# Patient Record
Sex: Female | Born: 1955 | Race: White | Hispanic: No | Marital: Married | Smoking: Current every day smoker
Health system: Southern US, Community
[De-identification: ages and names within clinical notes are randomized; demographics above are authoritative.]

## PROBLEM LIST (undated history)

## (undated) DIAGNOSIS — Z803 Family history of malignant neoplasm of breast: Secondary | ICD-10-CM

## (undated) DIAGNOSIS — Z85828 Personal history of other malignant neoplasm of skin: Secondary | ICD-10-CM

## (undated) DIAGNOSIS — Z8051 Family history of malignant neoplasm of kidney: Secondary | ICD-10-CM

## (undated) DIAGNOSIS — Z801 Family history of malignant neoplasm of trachea, bronchus and lung: Secondary | ICD-10-CM

## (undated) DIAGNOSIS — Z808 Family history of malignant neoplasm of other organs or systems: Secondary | ICD-10-CM

## (undated) DIAGNOSIS — E7800 Pure hypercholesterolemia, unspecified: Secondary | ICD-10-CM

## (undated) DIAGNOSIS — I10 Essential (primary) hypertension: Secondary | ICD-10-CM

## (undated) HISTORY — DX: Family history of malignant neoplasm of kidney: Z80.51

## (undated) HISTORY — PX: ROTATOR CUFF REPAIR: SHX139

## (undated) HISTORY — DX: Family history of malignant neoplasm of trachea, bronchus and lung: Z80.1

## (undated) HISTORY — PX: CHOLECYSTECTOMY: SHX55

## (undated) HISTORY — PX: ABDOMINAL HYSTERECTOMY: SHX81

## (undated) HISTORY — DX: Family history of malignant neoplasm of other organs or systems: Z80.8

## (undated) HISTORY — DX: Family history of malignant neoplasm of breast: Z80.3

## (undated) HISTORY — DX: Personal history of other malignant neoplasm of skin: Z85.828

## (undated) HISTORY — PX: MANDIBLE SURGERY: SHX707

## (undated) HISTORY — PX: TUBAL LIGATION: SHX77

## (undated) HISTORY — PX: CARPAL TUNNEL RELEASE: SHX101

---

## 1997-11-29 ENCOUNTER — Ambulatory Visit (HOSPITAL_COMMUNITY): Admission: RE | Admit: 1997-11-29 | Discharge: 1997-11-29 | Payer: Self-pay | Admitting: Family Medicine

## 1999-01-15 ENCOUNTER — Other Ambulatory Visit: Admission: RE | Admit: 1999-01-15 | Discharge: 1999-01-15 | Payer: Self-pay | Admitting: Family Medicine

## 1999-03-23 ENCOUNTER — Ambulatory Visit: Admission: RE | Admit: 1999-03-23 | Discharge: 1999-03-23 | Payer: Self-pay | Admitting: Family Medicine

## 2000-01-25 ENCOUNTER — Other Ambulatory Visit: Admission: RE | Admit: 2000-01-25 | Discharge: 2000-02-11 | Payer: Self-pay | Admitting: Family Medicine

## 2001-02-09 ENCOUNTER — Other Ambulatory Visit: Admission: RE | Admit: 2001-02-09 | Discharge: 2001-02-09 | Payer: Self-pay | Admitting: Family Medicine

## 2002-02-15 ENCOUNTER — Other Ambulatory Visit: Admission: RE | Admit: 2002-02-15 | Discharge: 2002-02-15 | Payer: Self-pay | Admitting: Family Medicine

## 2002-04-17 ENCOUNTER — Encounter: Payer: Self-pay | Admitting: Family Medicine

## 2002-04-17 ENCOUNTER — Ambulatory Visit (HOSPITAL_COMMUNITY): Admission: RE | Admit: 2002-04-17 | Discharge: 2002-04-17 | Payer: Self-pay | Admitting: Family Medicine

## 2003-02-21 ENCOUNTER — Other Ambulatory Visit: Admission: RE | Admit: 2003-02-21 | Discharge: 2003-02-21 | Payer: Self-pay | Admitting: Family Medicine

## 2004-03-05 ENCOUNTER — Other Ambulatory Visit: Admission: RE | Admit: 2004-03-05 | Discharge: 2004-03-05 | Payer: Self-pay | Admitting: Family Medicine

## 2007-02-22 ENCOUNTER — Encounter: Admission: RE | Admit: 2007-02-22 | Discharge: 2007-02-22 | Payer: Self-pay | Admitting: Family Medicine

## 2009-02-03 ENCOUNTER — Encounter: Admission: RE | Admit: 2009-02-03 | Discharge: 2009-02-03 | Payer: Self-pay | Admitting: Gastroenterology

## 2010-08-24 ENCOUNTER — Emergency Department (HOSPITAL_COMMUNITY): Payer: 59

## 2010-08-24 ENCOUNTER — Emergency Department (HOSPITAL_COMMUNITY)
Admission: EM | Admit: 2010-08-24 | Discharge: 2010-08-24 | Disposition: A | Discharge status: Home / Self Care | Payer: 59 | Attending: Emergency Medicine | Admitting: Emergency Medicine

## 2010-08-24 DIAGNOSIS — E785 Hyperlipidemia, unspecified: Secondary | ICD-10-CM | POA: Insufficient documentation

## 2010-08-24 DIAGNOSIS — R42 Dizziness and giddiness: Secondary | ICD-10-CM | POA: Insufficient documentation

## 2010-08-24 DIAGNOSIS — H6120 Impacted cerumen, unspecified ear: Secondary | ICD-10-CM | POA: Insufficient documentation

## 2010-08-24 DIAGNOSIS — H539 Unspecified visual disturbance: Secondary | ICD-10-CM | POA: Insufficient documentation

## 2010-08-24 DIAGNOSIS — R110 Nausea: Secondary | ICD-10-CM | POA: Insufficient documentation

## 2010-08-24 DIAGNOSIS — I10 Essential (primary) hypertension: Secondary | ICD-10-CM | POA: Insufficient documentation

## 2010-08-24 LAB — BASIC METABOLIC PANEL
CO2: 26 mEq/L (ref 19–32)
Calcium: 9.1 mg/dL (ref 8.4–10.5)
Chloride: 107 mEq/L (ref 96–112)
GFR calc Af Amer: >60 mL/min (ref >60)
Glucose, Bld: 101 mg/dL — ABNORMAL HIGH (ref 70–99)
Potassium: 4.8 mEq/L (ref 3.5–5.1)
Sodium: 141 mEq/L (ref 135–145)

## 2010-08-24 LAB — DIFFERENTIAL
Basophils Absolute: 0.1 10*3/uL (ref 0.0–0.1)
Basophils Relative: 1 % (ref 0–1)
Eosinophils Absolute: 0.4 10*3/uL (ref 0.0–0.7)
Eosinophils Relative: 4 % (ref 0–5)
Lymphocytes Relative: 40 % (ref 12–46)
Lymphs Abs: 3.6 10*3/uL (ref 0.7–4.0)
Monocytes Absolute: 0.5 10*3/uL (ref 0.1–1.0)
Monocytes Relative: 6 % (ref 3–12)
Neutro Abs: 4.6 10*3/uL (ref 1.7–7.7)
Neutrophils Relative %: 50 % (ref 43–77)

## 2010-08-24 LAB — URINALYSIS, ROUTINE W REFLEX MICROSCOPIC
Bilirubin Urine: NEGATIVE
Glucose, UA: NEGATIVE mg/dL
Ketones, ur: NEGATIVE mg/dL
Nitrite: NEGATIVE
Protein, ur: NEGATIVE mg/dL
Specific Gravity, Urine: 1.01 (ref 1.005–1.030)
Urobilinogen, UA: 0.2 mg/dL (ref 0.0–1.0)
pH: 6 (ref 5.0–8.0)

## 2010-08-24 LAB — CBC
HCT: 43.9 % (ref 36.0–46.0)
Hemoglobin: 15.3 g/dL — ABNORMAL HIGH (ref 12.0–15.0)
MCH: 32.4 pg (ref 26.0–34.0)
MCHC: 34.9 g/dL (ref 30.0–36.0)
MCV: 93 fL (ref 78.0–100.0)
Platelets: 227 10*3/uL (ref 150–400)
RBC: 4.72 MIL/uL (ref 3.87–5.11)
RDW: 13 % (ref 11.5–15.5)
WBC: 9.2 10*3/uL (ref 4.0–10.5)

## 2010-08-24 LAB — URINE MICROSCOPIC-ADD ON

## 2010-08-24 MED ORDER — GADOBENATE DIMEGLUMINE 529 MG/ML IV SOLN
15.0000 mL | Freq: Once | INTRAVENOUS | Status: AC
  Administered 2010-08-24: 15 mL via INTRAVENOUS

## 2010-09-14 ENCOUNTER — Other Ambulatory Visit: Payer: Self-pay | Admitting: Family Medicine

## 2010-09-14 ENCOUNTER — Other Ambulatory Visit (HOSPITAL_COMMUNITY)
Admission: RE | Admit: 2010-09-14 | Discharge: 2010-09-14 | Disposition: A | Discharge status: Home / Self Care | Payer: 59 | Source: Ambulatory Visit | Attending: Family Medicine | Admitting: Family Medicine

## 2010-09-14 DIAGNOSIS — Z01419 Encounter for gynecological examination (general) (routine) without abnormal findings: Secondary | ICD-10-CM | POA: Insufficient documentation

## 2011-06-28 ENCOUNTER — Other Ambulatory Visit: Payer: Self-pay | Admitting: Gastroenterology

## 2012-11-18 ENCOUNTER — Telehealth: Payer: Self-pay | Admitting: Diagnostic Neuroimaging

## 2012-11-18 NOTE — Telephone Encounter (Signed)
Calling to reschedule patient with Dr.Penumalli  02/02/2013 appt

## 2013-01-21 ENCOUNTER — Telehealth: Payer: Self-pay | Admitting: Diagnostic Neuroimaging

## 2013-02-02 ENCOUNTER — Ambulatory Visit: Payer: Self-pay | Admitting: Diagnostic Neuroimaging

## 2013-04-08 ENCOUNTER — Other Ambulatory Visit: Payer: Self-pay | Admitting: Family Medicine

## 2013-04-08 DIAGNOSIS — R319 Hematuria, unspecified: Secondary | ICD-10-CM

## 2013-04-12 ENCOUNTER — Ambulatory Visit
Admission: RE | Admit: 2013-04-12 | Discharge: 2013-04-12 | Disposition: A | Discharge status: Home / Self Care | Payer: 59 | Source: Ambulatory Visit | Attending: Family Medicine | Admitting: Family Medicine

## 2013-04-12 DIAGNOSIS — R319 Hematuria, unspecified: Secondary | ICD-10-CM

## 2013-05-11 ENCOUNTER — Ambulatory Visit: Payer: Self-pay | Admitting: Diagnostic Neuroimaging

## 2013-07-08 ENCOUNTER — Other Ambulatory Visit: Payer: Self-pay | Admitting: Gastroenterology

## 2013-10-05 ENCOUNTER — Encounter (HOSPITAL_BASED_OUTPATIENT_CLINIC_OR_DEPARTMENT_OTHER): Payer: Self-pay | Admitting: Emergency Medicine

## 2013-10-05 ENCOUNTER — Emergency Department (HOSPITAL_BASED_OUTPATIENT_CLINIC_OR_DEPARTMENT_OTHER)
Admission: EM | Admit: 2013-10-05 | Discharge: 2013-10-05 | Disposition: A | Discharge status: Home / Self Care | Payer: 59 | Attending: Emergency Medicine | Admitting: Emergency Medicine

## 2013-10-05 DIAGNOSIS — J019 Acute sinusitis, unspecified: Secondary | ICD-10-CM | POA: Insufficient documentation

## 2013-10-05 HISTORY — DX: Pure hypercholesterolemia, unspecified: E78.00

## 2013-10-05 HISTORY — DX: Essential (primary) hypertension: I10

## 2013-10-05 MED ORDER — ZITHROMAX Z-PAK 250 MG PO TABS: ORAL_TABLET | ORAL | Status: DC

## 2013-10-05 NOTE — ED Provider Notes (Signed)
CSN: [id number]     Arrival date & time 10/05/13  1043 History   First MD Initiated Contact with Patient 10/05/13 1054     Chief Complaint  Patient presents with  . Nasal Congestion     (Consider location/radiation/quality/duration/timing/severity/associated sxs/prior Treatment) HPI Comments: Patient is a 58 year old female with no significant past medical history. She presents today with complaints of sinus pain, congestion, green and bloody nasal discharge that has been ongoing for the past week. She was seen by her primary Dr. and told it was likely seasonal allergies. She did not improve with prednisone and an allergy pill. She called her doctor and was informed that a prescription for an antibiotic would be called in. Apparently there is been some confusion and she has been unable to get this medication from the pharmacy as they say they have not received the prescription.  Patient is a 58 y.o. female presenting with URI. The history is provided by the patient.  URI Presenting symptoms: congestion, cough and facial pain   Severity:  Moderate Onset quality:  Gradual Duration:  1 week Timing:  Constant Progression:  Worsening Chronicity:  New Relieved by:  Nothing Worsened by:  Nothing tried Ineffective treatments:  None tried   No past medical history on file. No past surgical history on file. No family history on file. History  Substance Use Topics  . Smoking status: Not on file  . Smokeless tobacco: Not on file  . Alcohol Use: Not on file   OB History   No data available     Review of Systems  HENT: Positive for congestion.   Respiratory: Positive for cough.   All other systems reviewed and are negative.     Allergies  Review of patient's allergies indicates not on file.  Home Medications   Prior to Admission medications   Not on File   BP 187/90  Pulse 84  Temp(Src) 97.9 F (36.6 C) (Oral)  Resp 18  SpO2 97% Physical Exam  Nursing note and vitals  reviewed. Constitutional: She is oriented to person, place, and time. She appears well-developed and well-nourished. No distress.  HENT:  Head: Normocephalic and atraumatic.  Mouth/Throat: Oropharynx is clear and moist.  TMs are clear bilaterally.  There is maxillofacial and frontal sinus tenderness to palpation.    Neck: Normal range of motion. Neck supple.  Cardiovascular: Normal rate and regular rhythm.  Exam reveals no gallop and no friction rub.   No murmur heard. Pulmonary/Chest: Effort normal and breath sounds normal. No respiratory distress. She has no wheezes.  Abdominal: Soft. Bowel sounds are normal. She exhibits no distension. There is no tenderness.  Musculoskeletal: Normal range of motion.  Lymphadenopathy:    She has no cervical adenopathy.  Neurological: She is alert and oriented to person, place, and time.  Skin: Skin is warm and dry. She is not diaphoretic.    ED Course  Procedures (including critical care time) Labs Review Labs Reviewed - No data to display  Imaging Review No results found.   EKG Interpretation None      MDM   Final diagnoses:  None    History and exam consistent with acute sinusitis. We'll prescribe Zithromax and when necessary followup. Patient also smokes one pack a day and was advised she should pursue smoking cessation techniques.    Geoffery Lyonsouglas Salih Williamson, MD 10/05/13 1106

## 2013-10-05 NOTE — Discharge Instructions (Signed)
Zithromax as prescribed.  Return to the emergency department for difficulty breathing, chest pain, or significant worsening of your symptoms.   Sinusitis Sinusitis is redness, soreness, and swelling (inflammation) of the paranasal sinuses. Paranasal sinuses are air pockets within the bones of your face (beneath the eyes, the middle of the forehead, or above the eyes). In healthy paranasal sinuses, mucus is able to drain out, and air is able to circulate through them by way of your nose. However, when your paranasal sinuses are inflamed, mucus and air can become trapped. This can allow bacteria and other germs to grow and cause infection. Sinusitis can develop quickly and last only a short time (acute) or continue over a long period (chronic). Sinusitis that lasts for more than 12 weeks is considered chronic.  CAUSES  Causes of sinusitis include:  Allergies.  Structural abnormalities, such as displacement of the cartilage that separates your nostrils (deviated septum), which can decrease the air flow through your nose and sinuses and affect sinus drainage.  Functional abnormalities, such as when the small hairs (cilia) that line your sinuses and help remove mucus do not work properly or are not present. SYMPTOMS  Symptoms of acute and chronic sinusitis are the same. The primary symptoms are pain and pressure around the affected sinuses. Other symptoms include:  Upper toothache.  Earache.  Headache.  Bad breath.  Decreased sense of smell and taste.  A cough, which worsens when you are lying flat.  Fatigue.  Fever.  Thick drainage from your nose, which often is green and may contain pus (purulent).  Swelling and warmth over the affected sinuses. DIAGNOSIS  Your caregiver will perform a physical exam. During the exam, your caregiver may:  Look in your nose for signs of abnormal growths in your nostrils (nasal polyps).  Tap over the affected sinus to check for signs of  infection.  View the inside of your sinuses (endoscopy) with a special imaging device with a light attached (endoscope), which is inserted into your sinuses. If your caregiver suspects that you have chronic sinusitis, one or more of the following tests may be recommended:  Allergy tests.  Nasal culture A sample of mucus is taken from your nose and sent to a lab and screened for bacteria.  Nasal cytology A sample of mucus is taken from your nose and examined by your caregiver to determine if your sinusitis is related to an allergy. TREATMENT  Most cases of acute sinusitis are related to a viral infection and will resolve on their own within 10 days. Sometimes medicines are prescribed to help relieve symptoms (pain medicine, decongestants, nasal steroid sprays, or saline sprays).  However, for sinusitis related to a bacterial infection, your caregiver will prescribe antibiotic medicines. These are medicines that will help kill the bacteria causing the infection.  Rarely, sinusitis is caused by a fungal infection. In theses cases, your caregiver will prescribe antifungal medicine. For some cases of chronic sinusitis, surgery is needed. Generally, these are cases in which sinusitis recurs more than 3 times per year, despite other treatments. HOME CARE INSTRUCTIONS   Drink plenty of water. Water helps thin the mucus so your sinuses can drain more easily.  Use a humidifier.  Inhale steam 3 to 4 times a day (for example, sit in the bathroom with the shower running).  Apply a warm, moist washcloth to your face 3 to 4 times a day, or as directed by your caregiver.  Use saline nasal sprays to help moisten and clean  your sinuses.  Take over-the-counter or prescription medicines for pain, discomfort, or fever only as directed by your caregiver. SEEK IMMEDIATE MEDICAL CARE IF:  You have increasing pain or severe headaches.  You have nausea, vomiting, or drowsiness.  You have swelling around your  face.  You have vision problems.  You have a stiff neck.  You have difficulty breathing. MAKE SURE YOU:   Understand these instructions.  Will watch your condition.  Will get help right away if you are not doing well or get worse. Document Released: 05/20/2005 Document Revised: 08/12/2011 Document Reviewed: 06/04/2011 [facility] Patient Information 2014 [place], [place].

## 2013-10-07 NOTE — Telephone Encounter (Signed)
Closing encounter

## 2015-09-12 ENCOUNTER — Encounter (HOSPITAL_BASED_OUTPATIENT_CLINIC_OR_DEPARTMENT_OTHER): Payer: Self-pay | Admitting: Emergency Medicine

## 2015-09-12 ENCOUNTER — Emergency Department (HOSPITAL_BASED_OUTPATIENT_CLINIC_OR_DEPARTMENT_OTHER)
Admission: EM | Admit: 2015-09-12 | Discharge: 2015-09-12 | Disposition: A | Discharge status: Home / Self Care | Payer: 59 | Attending: Emergency Medicine | Admitting: Emergency Medicine

## 2015-09-12 ENCOUNTER — Emergency Department (HOSPITAL_BASED_OUTPATIENT_CLINIC_OR_DEPARTMENT_OTHER): Payer: 59

## 2015-09-12 DIAGNOSIS — J219 Acute bronchiolitis, unspecified: Secondary | ICD-10-CM | POA: Diagnosis not present

## 2015-09-12 DIAGNOSIS — J9801 Acute bronchospasm: Secondary | ICD-10-CM

## 2015-09-12 DIAGNOSIS — Z79899 Other long term (current) drug therapy: Secondary | ICD-10-CM | POA: Insufficient documentation

## 2015-09-12 DIAGNOSIS — E7800 Pure hypercholesterolemia, unspecified: Secondary | ICD-10-CM | POA: Diagnosis not present

## 2015-09-12 DIAGNOSIS — Z880 Allergy status to penicillin: Secondary | ICD-10-CM | POA: Diagnosis not present

## 2015-09-12 DIAGNOSIS — F17210 Nicotine dependence, cigarettes, uncomplicated: Secondary | ICD-10-CM | POA: Insufficient documentation

## 2015-09-12 DIAGNOSIS — I10 Essential (primary) hypertension: Secondary | ICD-10-CM | POA: Diagnosis not present

## 2015-09-12 DIAGNOSIS — R079 Chest pain, unspecified: Secondary | ICD-10-CM | POA: Diagnosis present

## 2015-09-12 LAB — CBC WITH DIFFERENTIAL/PLATELET
Basophils Absolute: 0 10*3/uL (ref 0.0–0.1)
Basophils Relative: 1 %
EOS ABS: 0.3 10*3/uL (ref 0.0–0.7)
EOS PCT: 4 %
HCT: 47.8 % — ABNORMAL HIGH (ref 36.0–46.0)
HEMOGLOBIN: 16.4 g/dL — AB (ref 12.0–15.0)
LYMPHS ABS: 2.6 10*3/uL (ref 0.7–4.0)
LYMPHS PCT: 31 %
MCH: 32.6 pg (ref 26.0–34.0)
MCHC: 34.3 g/dL (ref 30.0–36.0)
MCV: 95 fL (ref 78.0–100.0)
MONOS PCT: 6 %
Monocytes Absolute: 0.5 10*3/uL (ref 0.1–1.0)
NEUTROS ABS: 4.9 10*3/uL (ref 1.7–7.7)
Neutrophils Relative %: 58 %
Platelets: 231 10*3/uL (ref 150–400)
RBC: 5.03 MIL/uL (ref 3.87–5.11)
RDW: 13.1 % (ref 11.5–15.5)
WBC: 8.3 10*3/uL (ref 4.0–10.5)

## 2015-09-12 LAB — COMPREHENSIVE METABOLIC PANEL
ALK PHOS: 81 U/L (ref 38–126)
ALT: 28 U/L (ref 14–54)
ANION GAP: 8 (ref 5–15)
AST: 30 U/L (ref 15–41)
Albumin: 4.6 g/dL (ref 3.5–5.0)
BUN: 10 mg/dL (ref 6–20)
CO2: 25 mmol/L (ref 22–32)
Calcium: 9.6 mg/dL (ref 8.9–10.3)
Chloride: 109 mmol/L (ref 101–111)
Creatinine, Ser: 0.78 mg/dL (ref 0.44–1.00)
Glucose, Bld: 114 mg/dL — ABNORMAL HIGH (ref 65–99)
Potassium: 3.8 mmol/L (ref 3.5–5.1)
Sodium: 142 mmol/L (ref 135–145)
TOTAL PROTEIN: 7.6 g/dL (ref 6.5–8.1)
Total Bilirubin: 0.8 mg/dL (ref 0.3–1.2)

## 2015-09-12 LAB — TROPONIN I

## 2015-09-12 LAB — LIPASE, BLOOD: LIPASE: 24 U/L (ref 11–51)

## 2015-09-12 MED ORDER — ALBUTEROL SULFATE (2.5 MG/3ML) 0.083% IN NEBU
5.0000 mg | INHALATION_SOLUTION | Freq: Once | RESPIRATORY_TRACT | Status: AC
  Administered 2015-09-12: 5 mg via RESPIRATORY_TRACT
  Filled 2015-09-12: qty 6

## 2015-09-12 MED ORDER — AEROCHAMBER PLUS FLO-VU MEDIUM MISC
1.0000 | Freq: Once | Status: DC
  Filled 2015-09-12: qty 1

## 2015-09-12 MED ORDER — ALBUTEROL SULFATE (2.5 MG/3ML) 0.083% IN NEBU: 2.5000 mg | INHALATION_SOLUTION | Freq: Four times a day (QID) | RESPIRATORY_TRACT | Status: DC | PRN

## 2015-09-12 MED ORDER — ALBUTEROL SULFATE HFA 108 (90 BASE) MCG/ACT IN AERS
2.0000 | INHALATION_SPRAY | RESPIRATORY_TRACT | Status: DC | PRN
  Filled 2015-09-12: qty 6.7

## 2015-09-12 MED ORDER — PREDNISONE 50 MG PO TABS
60.0000 mg | ORAL_TABLET | Freq: Once | ORAL | Status: AC
  Administered 2015-09-12: 60 mg via ORAL
  Filled 2015-09-12: qty 1

## 2015-09-12 MED ORDER — PREDNISONE 10 MG PO TABS: 40.0000 mg | ORAL_TABLET | Freq: Every day | ORAL | Status: AC

## 2015-09-12 MED ORDER — IPRATROPIUM BROMIDE 0.02 % IN SOLN
0.5000 mg | Freq: Once | RESPIRATORY_TRACT | Status: AC
  Administered 2015-09-12: 0.5 mg via RESPIRATORY_TRACT
  Filled 2015-09-12: qty 2.5

## 2015-09-12 MED FILL — predniSONE 10 MG TABS: 10 | 4 days supply | Qty: 16 | Fill #0

## 2015-09-12 MED FILL — ALBUTEROL 0.083% INHAL SOLN: (2.5 MG/3ML | 6 days supply | Qty: 75 | Fill #0

## 2015-09-12 NOTE — Discharge Instructions (Signed)
You were seen and evaluated today for chest pain.  This appears to be secondary to your cough and wheezing.  Use the inhaler/nebulizer as needed.  They deliver the same medication so be aware to space the medicine as instructed.  Take the steroids as directed.  Return immediately if symptoms suddenly worsen.  Follow up with your primary care physician.  Bronchospasm, Adult A bronchospasm is a spasm or tightening of the airways going into the lungs. During a bronchospasm breathing becomes more difficult because the airways get smaller. When this happens there can be coughing, a whistling sound when breathing (wheezing), and difficulty breathing. Bronchospasm is often associated with asthma, but not all patients who experience a bronchospasm have asthma. CAUSES  A bronchospasm is caused by inflammation or irritation of the airways. The inflammation or irritation may be triggered by:   Allergies (such as to animals, pollen, food, or mold). Allergens that cause bronchospasm may cause wheezing immediately after exposure or many hours later.   Infection. Viral infections are believed to be the most common cause of bronchospasm.   Exercise.   Irritants (such as pollution, cigarette smoke, strong odors, aerosol sprays, and paint fumes).   Weather changes. Winds increase molds and pollens in the air. Rain refreshes the air by washing irritants out. Cold air may cause inflammation.   Stress and emotional upset.  SIGNS AND SYMPTOMS   Wheezing.   Excessive nighttime coughing.   Frequent or severe coughing with a simple cold.   Chest tightness.   Shortness of breath.  DIAGNOSIS  Bronchospasm is usually diagnosed through a history and physical exam. Tests, such as chest X-rays, are sometimes done to look for other conditions. TREATMENT   Inhaled medicines can be given to open up your airways and help you breathe. The medicines can be given using either an inhaler or a nebulizer  machine.  Corticosteroid medicines may be given for severe bronchospasm, usually when it is associated with asthma. HOME CARE INSTRUCTIONS   Always have a plan prepared for seeking medical care. Know when to call your health care provider and local emergency services (911 in the [place] Know where you can access local emergency care.  Only take medicines as directed by your health care provider.  If you were prescribed an inhaler or nebulizer machine, ask your health care provider to explain how to use it correctly. Always use a spacer with your inhaler if you were given one.  It is necessary to remain calm during an attack. Try to relax and breathe more slowly.  Control your home environment in the following ways:   Change your heating and air conditioning filter at least once a month.   Limit your use of fireplaces and wood stoves.  Do not smoke and do not allow smoking in your home.   Avoid exposure to perfumes and fragrances.   Get rid of pests (such as roaches and mice) and their droppings.   Throw away plants if you see mold on them.   Keep your house clean and dust free.   Replace carpet with wood, tile, or vinyl flooring. Carpet can trap dander and dust.   Use allergy-proof pillows, mattress covers, and box spring covers.   Wash bed sheets and blankets every week in hot water and dry them in a dryer.   Use blankets that are made of polyester or cotton.   Wash hands frequently. SEEK MEDICAL CARE IF:   You have muscle aches.   You have chest  pain.   The sputum changes from clear or white to yellow, green, gray, or bloody.   The sputum you cough up gets thicker.   There are problems that may be related to the medicine you are given, such as a rash, itching, swelling, or trouble breathing.  SEEK IMMEDIATE MEDICAL CARE IF:   You have worsening wheezing and coughing even after taking your prescribed medicines.   You have increased difficulty  breathing.   You develop severe chest pain. MAKE SURE YOU:   Understand these instructions.  Will watch your condition.  Will get help right away if you are not doing well or get worse.   This information is not intended to replace advice given to you by your health care provider. Make sure you discuss any questions you have with your health care provider.   Document Released: 05/23/2003 Document Revised: 06/10/2014 Document Reviewed: 11/09/2012 Elsevier Interactive Patient Education [employer]2016 [employer]

## 2015-09-12 NOTE — ED Provider Notes (Signed)
CSN: [id number]     Arrival date & time 09/12/15  0827 History   First MD Initiated Contact with Patient 09/12/15 [id number]     Chief Complaint  Patient presents with  . Chest Pain     (Consider location/radiation/quality/duration/timing/severity/associated sxs/prior Treatment) HPI Comments: 60 year old female with a history of chronic smoking, hypertension presents for chest pain. The patient reports that she's been coughing over the last 3 days. She has had some sputum production that has been clear in color. She denies any fever. She reports waking up from sleep coughing and feeling short of breath. She says today she was coughing and then developed a vice-like, spasming pain in her bilateral lower chest and between her shoulder blades. Denies ever having symptoms like this before. Denies any known history of COPD. Has never been on any inhalers. No known sick contacts.   Past Medical History  Diagnosis Date  . Hypertension   . High cholesterol    Past Surgical History  Procedure Laterality Date  . Rotator cuff repair    . Abdominal hysterectomy    . Facial fracture surgery    . Carpal tunnel release     No family history on file. Social History  Substance Use Topics  . Smoking status: Current Every Day Smoker -- 1.00 packs/day    Types: Cigarettes  . Smokeless tobacco: None  . Alcohol Use: 1.2 oz/week    2 Cans of beer per week     Comment: 25oz beer   09/12/15 states is a daily drinker   OB History    No data available     Review of Systems  Constitutional: Negative for fever, diaphoresis, appetite change and fatigue.  HENT: Negative for congestion, rhinorrhea, sinus pressure and sore throat.   Eyes: Negative for pain and redness.  Respiratory: Positive for cough and shortness of breath. Negative for chest tightness and wheezing.   Cardiovascular: Positive for chest pain. Negative for palpitations and leg swelling.  Gastrointestinal: Negative for nausea, vomiting,  abdominal pain and diarrhea.  Genitourinary: Negative for dysuria, urgency and hematuria.  Musculoskeletal: Negative for myalgias, back pain, joint swelling and neck pain.  Skin: Negative for rash.  Neurological: Negative for dizziness, weakness and headaches.  Hematological: Does not bruise/bleed easily.      Allergies  Erythromycin; Penicillins; and Prednisone  Home Medications   Prior to Admission medications   Medication Sig Start Date End Date Taking? Authorizing Provider  albuterol (PROVENTIL) (2.5 MG/3ML) 0.083% nebulizer solution Take 3 mLs (2.5 mg total) by nebulization every 6 (six) hours as needed for wheezing or shortness of breath. 09/12/15   Leta BaptistEmily Roe Ariann Khaimov, MD  lisinopril (PRINIVIL,ZESTRIL) 20 MG tablet Take 20 mg by mouth daily.    Historical Provider, MD  predniSONE (DELTASONE) 10 MG tablet Take 4 tablets (40 mg total) by mouth daily. 09/13/15 09/16/15  Leta BaptistEmily Roe Bess Saltzman, MD  simvastatin (ZOCOR) 40 MG tablet Take 40 mg by mouth daily.    Historical Provider, MD  ZITHROMAX Z-PAK 250 MG tablet 2 po day one, then 1 daily x 4 days 10/05/13   Geoffery Lyonsouglas Delo, MD   BP 124/86 mmHg  Pulse 92  Temp(Src) 98.1 F (36.7 C) (Oral)  Resp 18  Ht 5\' 5"  (1.651 m)  Wt 153 lb (69.4 kg)  BMI 25.46 kg/m2  SpO2 97% Physical Exam  Constitutional: She is oriented to person, place, and time. She appears well-developed and well-nourished. No distress.  HENT:  Head: Normocephalic and atraumatic.  Right Ear:  External ear normal.  Left Ear: External ear normal.  Nose: Nose normal.  Mouth/Throat: Oropharynx is clear and moist. No oropharyngeal exudate.  Eyes: EOM are normal. Pupils are equal, round, and reactive to light.  Neck: Normal range of motion. Neck supple.  Cardiovascular: Normal rate, regular rhythm, normal heart sounds and intact distal pulses.   No murmur heard. Pulmonary/Chest: Effort normal. No respiratory distress. She has wheezes (scattered bilaterally with tight breath  sounds). She has no rales.  Abdominal: Soft. She exhibits no distension. There is no tenderness.  Musculoskeletal: Normal range of motion. She exhibits no edema or tenderness.  Neurological: She is alert and oriented to person, place, and time.  Skin: Skin is warm and dry. No rash noted. She is not diaphoretic.  Vitals reviewed.   ED Course  Procedures (including critical care time) Labs Review Labs Reviewed  CBC WITH DIFFERENTIAL/PLATELET - Abnormal; Notable for the following:    Hemoglobin 16.4 (*)    HCT 47.8 (*)    All other components within normal limits  COMPREHENSIVE METABOLIC PANEL - Abnormal; Notable for the following:    Glucose, Bld 114 (*)    All other components within normal limits  LIPASE, BLOOD  TROPONIN I    Imaging Review Dg Chest 2 View  09/12/2015  CLINICAL DATA:  Acute chest pain. EXAM: CHEST  2 VIEW COMPARISON:  None. FINDINGS: The heart size and mediastinal contours are within normal limits. Both lungs are clear. No pneumothorax or pleural effusion is noted. The visualized skeletal structures are unremarkable. IMPRESSION: No active cardiopulmonary disease. Electronically Signed   By: Lupita Raider, M.D.   On: 09/12/2015 10:04   I have personally reviewed and evaluated these images and lab results as part of my medical decision-making.   EKG Interpretation   Date/Time:  Tuesday September 12 2015 09:52:23 EDT Ventricular Rate:  84 PR Interval:  124 QRS Duration: 96 QT Interval:  372 QTC Calculation: 440 R Axis:   60 Text Interpretation:  Sinus rhythm No significant change since last  tracing Confirmed by Sonnia Strong ([id number]) on 09/12/2015 10:05:55 AM      MDM  Patient was seen and evaluated in stable condition. Wheezing on examination. History of long-term cigarette smoking. Chest x-ray unremarkable. EKG unremarkable. Laboratory studies unremarkable. Patient given breathing treatment with improvement in symptoms. She was moving more air on examination  with more wheezes but felt significantly better. Patient was also given a dose of prednisone. Pain likely secondary to cough and bronchospasm. Suspect that the patient has underlying COPD. Patient was discharged home with prescriptions for albuterol as well as for prednisone. She was told to follow-up with her primary care physician. Strict return precautions were given.  Final diagnoses:  Bronchospasm    1. Bronchospasm  2. Chest pain    Leta Baptist, MD 09/12/15 1116

## 2015-09-12 NOTE — ED Notes (Signed)
Cough x 3 days   Now chest and back pain

## 2018-03-04 ENCOUNTER — Emergency Department (HOSPITAL_BASED_OUTPATIENT_CLINIC_OR_DEPARTMENT_OTHER)
Admission: EM | Admit: 2018-03-04 | Discharge: 2018-03-04 | Disposition: A | Discharge status: Home / Self Care | Payer: 59 | Attending: Emergency Medicine | Admitting: Emergency Medicine

## 2018-03-04 ENCOUNTER — Other Ambulatory Visit: Payer: Self-pay

## 2018-03-04 ENCOUNTER — Encounter (HOSPITAL_BASED_OUTPATIENT_CLINIC_OR_DEPARTMENT_OTHER): Payer: Self-pay | Admitting: *Deleted

## 2018-03-04 DIAGNOSIS — R51 Headache: Secondary | ICD-10-CM | POA: Diagnosis not present

## 2018-03-04 DIAGNOSIS — Z5321 Procedure and treatment not carried out due to patient leaving prior to being seen by health care provider: Secondary | ICD-10-CM | POA: Insufficient documentation

## 2018-03-04 DIAGNOSIS — F419 Anxiety disorder, unspecified: Secondary | ICD-10-CM | POA: Diagnosis present

## 2018-03-04 NOTE — ED Triage Notes (Signed)
Pt c/o anxiety and h/a x 3 weeks, seen by PMD for same , Gi tomorrow .

## 2018-03-11 ENCOUNTER — Encounter: Payer: Self-pay | Admitting: Obstetrics and Gynecology

## 2018-03-11 ENCOUNTER — Other Ambulatory Visit: Payer: Self-pay

## 2018-03-11 ENCOUNTER — Ambulatory Visit: Payer: 59 | Admitting: Obstetrics and Gynecology

## 2018-03-11 VITALS — BP 126/86 | HR 76 | Ht 64.57 in | Wt 164.6 lb

## 2018-03-11 DIAGNOSIS — Z01419 Encounter for gynecological examination (general) (routine) without abnormal findings: Secondary | ICD-10-CM

## 2018-03-11 DIAGNOSIS — N393 Stress incontinence (female) (male): Secondary | ICD-10-CM | POA: Diagnosis not present

## 2018-03-11 NOTE — Patient Instructions (Addendum)
EXERCISE AND DIET:  We recommended that you start or continue a regular exercise program for good health. Regular exercise means any activity that makes your heart beat faster and makes you sweat.  We recommend exercising at least 30 minutes per day at least 3 days a week, preferably 4 or 5.  We also recommend a diet low in fat and sugar.  Inactivity, poor dietary choices and obesity can cause diabetes, heart attack, stroke, and kidney damage, among others.    ALCOHOL AND SMOKING:  Women should limit their alcohol intake to no more than 7 drinks/beers/glasses of wine (combined, not each!) per week. Moderation of alcohol intake to this level decreases your risk of breast cancer and liver damage. And of course, no recreational drugs are part of a healthy lifestyle.  And absolutely no smoking or even second hand smoke. Most people know smoking can cause heart and lung diseases, but did you know it also contributes to weakening of your bones? Aging of your skin?  Yellowing of your teeth and nails?  CALCIUM AND VITAMIN D:  Adequate intake of calcium and Vitamin D are recommended.  The recommendations for exact amounts of these supplements seem to change often, but generally speaking 1,200 mg of calcium (between died and supplement) and 800 units of Vitamin D per day seems prudent. Certain women may benefit from higher intake of Vitamin D.  If you are among these women, your doctor will have told you during your visit.    PAP SMEARS:  Pap smears, to check for cervical cancer or precancers,  have traditionally been done yearly, although recent scientific advances have shown that most women can have pap smears less often.  However, every woman still should have a physical exam from her gynecologist every year. It will include a breast check, inspection of the vulva and vagina to check for abnormal growths or skin changes, a visual exam of the cervix, and then an exam to evaluate the size and shape of the uterus and  ovaries.  And after 63 years of age, a rectal exam is indicated to check for rectal cancers. We will also provide age appropriate advice regarding health maintenance, like when you should have certain vaccines, screening for sexually transmitted diseases, bone density testing, colonoscopy, mammograms, etc.   MAMMOGRAMS:  All women over 63 years old should have a yearly mammogram. Many facilities now offer a "3D" mammogram, which may cost around $50 extra out of pocket. If possible,  we recommend you accept the option to have the 3D mammogram performed.  It both reduces the number of women who will be called back for extra views which then turn out to be normal, and it is better than the routine mammogram at detecting truly abnormal areas.    COLONOSCOPY:  Colonoscopy to screen for colon cancer is recommended for all women at age 63.  We know, you hate the idea of the prep.  We agree, BUT, having colon cancer and not knowing it is worse!!  Colon cancer so often starts as a polyp that can be seen and removed at colonscopy, which can quite literally save your life!  And if your first colonoscopy is normal and you have no family history of colon cancer, most women don't have to have it again for 10 years.  Once every ten years, you can do something that may end up saving your life, right?  We will be happy to help you get it scheduled when you are ready.  Be sure to check your insurance coverage so you understand how much it will cost.  It may be covered as a preventative service at no cost, but you should check your particular policy.     Kegel Exercises Kegel exercises help strengthen the muscles that support the rectum, vagina, small intestine, bladder, and uterus. Doing Kegel exercises can help:  Improve bladder and bowel control.  Improve sexual response.  Reduce problems and discomfort during pregnancy.  Kegel exercises involve squeezing your pelvic floor muscles, which are the same muscles you  squeeze when you try to stop the flow of urine. The exercises can be done while sitting, standing, or lying down, but it is best to vary your position. Phase 1 exercises 1. Squeeze your pelvic floor muscles tight. You should feel a tight lift in your rectal area. If you are a female, you should also feel a tightness in your vaginal area. Keep your stomach, buttocks, and legs relaxed. 2. Hold the muscles tight for up to 10 seconds. 3. Relax your muscles. Repeat this exercise 50 times a day or as many times as told by your health care provider. Continue to do this exercise for at least 4-6 weeks or for as long as told by your health care provider. This information is not intended to replace advice given to you by your health care provider. Make sure you discuss any questions you have with your health care provider. Document Released: 05/06/2012 Document Revised: 01/13/2016 Document Reviewed: 04/09/2015 Elsevier Interactive Patient Education  2018 [employer].  Breast Self-Awareness Breast self-awareness means being familiar with how your breasts look and feel. It involves checking your breasts regularly and reporting any changes to your health care provider. Practicing breast self-awareness is important. A change in your breasts can be a sign of a serious medical problem. Being familiar with how your breasts look and feel allows you to find any problems early, when treatment is more likely to be successful. All women should practice breast self-awareness, including women who have had breast implants. How to do a breast self-exam One way to learn what is normal for your breasts and whether your breasts are changing is to do a breast self-exam. To do a breast self-exam: Look for Changes  1. Remove all the clothing above your waist. 2. Stand in front of a mirror in a room with good lighting. 3. Put your hands on your hips. 4. Push your hands firmly downward. 5. Compare your breasts in the mirror.  Look for differences between them (asymmetry), such as: ? Differences in shape. ? Differences in size. ? Puckers, dips, and bumps in one breast and not the other. 6. Look at each breast for changes in your skin, such as: ? Redness. ? Scaly areas. 7. Look for changes in your nipples, such as: ? Discharge. ? Bleeding. ? Dimpling. ? Redness. ? A change in position. Feel for Changes  Carefully feel your breasts for lumps and changes. It is best to do this while lying on your back on the floor and again while sitting or standing in the shower or tub with soapy water on your skin. Feel each breast in the following way:  Place the arm on the side of the breast you are examining above your head.  Feel your breast with the other hand.  Start in the nipple area and make  inch (2 cm) overlapping circles to feel your breast. Use the pads of your three middle fingers to do this. Apply  light pressure, then medium pressure, then firm pressure. The light pressure will allow you to feel the tissue closest to the skin. The medium pressure will allow you to feel the tissue that is a little deeper. The firm pressure will allow you to feel the tissue close to the ribs.  Continue the overlapping circles, moving downward over the breast until you feel your ribs below your breast.  Move one finger-width toward the center of the body. Continue to use the  inch (2 cm) overlapping circles to feel your breast as you move slowly up toward your collarbone.  Continue the up and down exam using all three pressures until you reach your armpit.  Write Down What You Find  Write down what is normal for each breast and any changes that you find. Keep a written record with breast changes or normal findings for each breast. By writing this information down, you do not need to depend only on memory for size, tenderness, or location. Write down where you are in your menstrual cycle, if you are still menstruating. If you  are having trouble noticing differences in your breasts, do not get discouraged. With time you will become more familiar with the variations in your breasts and more comfortable with the exam. How often should I examine my breasts? Examine your breasts every month. If you are breastfeeding, the best time to examine your breasts is after a feeding or after using a breast pump. If you menstruate, the best time to examine your breasts is 5-7 days after your period is over. During your period, your breasts are lumpier, and it may be more difficult to notice changes. When should I see my health care provider? See your health care provider if you notice:  A change in shape or size of your breasts or nipples.  A change in the skin of your breast or nipples, such as a reddened or scaly area.  Unusual discharge from your nipples.  A lump or thick area that was not there before.  Pain in your breasts.  Anything that concerns you.  This information is not intended to replace advice given to you by your health care provider. Make sure you discuss any questions you have with your health care provider. Document Released: 05/20/2005 Document Revised: 10/26/2015 Document Reviewed: 04/09/2015 Elsevier Interactive Patient Education  [employer].

## 2018-03-11 NOTE — Progress Notes (Signed)
62 y.o. G32P2002 Married White or Caucasian Not Hispanic or Latino female here for annual exam.   In May she had an abdominal CT with contrast as part of a w/u for hematuria (ordered by Urology). Ever since the CT "I haven't been right". She has an intermittent vice like tightening feeling from her upper abdomen to her neck, then her head shakes, feels like her insides are wiggling. Sometimes she gets shaking in her arms/upper body. Symptoms can last 10 seconds to a couple of minutes. Dr Yetta Barre is working her up for this. Since May she is also having headaches, almost constant in the last 6 weeks. She just had a head CT and echo on Monday, results are pending. Labs were done recently, she hasn't heard the results yet.  She had a hysterectomy around age 58, she still has her ovaries. She went through menopause around 49 with hot flashes, night sweats and vaginal dryness. No longer having any of those symptoms.  Sexually active, no pain.    No LMP recorded. Patient has had a hysterectomy.          Sexually active: Yes.    The current method of family planning is post menopausal status, tubal ligation, partial hysterectomy.  Exercising: Yes.    total gym Smoker:  Yes, 1 PPD, she would love to quit, doesn't have the will power. Has tried everything to quit, nothing has worked.   Health Maintenance: Pap:  Patient is unsure History of abnormal Pap:  no MMG:  03/2017 done at [facility] normal per patient, will call for report. She will schedule.  BMD:  Never. Plans to do with primary.  Colonoscopy: 2015, polyps due in 2020 TDaP:  Unsure, will do with primary   reports that she has been smoking cigarettes. She has been smoking about 1.00 pack per day. She has never used smokeless tobacco. She reports that she drinks about 12.0 standard drinks of alcohol per week. She reports that she does not use drugs. She is retired, worked in [employer]. Married x 45 years. Husband has COPD and is on O2, has scarring from  long term asbestos exposure. 2 grown sons, 2 granddaughters (both live with her 19,15). One son lives with his female partner in [place]. Other son, father to her grandchildren, lives right next to her (recovering alcoholic).   Past Medical History:  Diagnosis Date  . High cholesterol   . Hypertension     Past Surgical History:  Procedure Laterality Date  . ABDOMINAL HYSTERECTOMY    . CARPAL TUNNEL RELEASE    . MANDIBLE SURGERY    . ROTATOR CUFF REPAIR    . TUBAL LIGATION      Current Outpatient Medications  Medication Sig Dispense Refill  . losartan (COZAAR) 100 MG tablet Take 100 mg by mouth daily.    . simvastatin (ZOCOR) 40 MG tablet Take 40 mg by mouth daily.     No current facility-administered medications for this visit.     Family History  Problem Relation Age of Onset  . Hypertension Mother   . Diabetes Mother   . Hypertension Father   . Aneurysm Father   . Hypertension Sister   . Diabetes Sister   . Breast cancer Sister   . Kidney cancer Sister   . Crohn's disease Sister   . Anuerysm Sister   Sister was diagnosed with breast cancer at 48, got a recurrence and had bilateral mastectomies.   Review of Systems  Constitutional: Negative.  HENT: Negative.   Eyes: Negative.   Respiratory: Negative.   Cardiovascular: Negative.   Gastrointestinal: Negative.   Endocrine: Negative.   Genitourinary: Negative.   Musculoskeletal: Negative.   Skin: Negative.   Allergic/Immunologic: Negative.   Neurological: Positive for headaches.  Hematological: Negative.   Psychiatric/Behavioral:       Anxiety  Mild GSI a couple of times a week with valsalva.   Exam:   BP 126/86 (BP Location: Right Arm, Patient Position: Sitting, Cuff Size: Normal)   Pulse 76   Ht 5' 4.57" (1.64 m)   Wt 164 lb 9.6 oz (74.7 kg)   BMI 27.76 kg/m   Weight change: @WEIGHTCHANGE @ Height:   Height: 5' 4.57" (164 cm)  Ht Readings from Last 3 Encounters:  03/11/18 5' 4.57" (1.64 m)  03/04/18  5\' 5"  (1.651 m)  09/12/15 5\' 5"  (1.651 m)    General appearance: alert, cooperative and appears stated age Head: Normocephalic, without obvious abnormality, atraumatic Neck: no adenopathy, supple, symmetrical, trachea midline and thyroid normal to inspection and palpation Lungs: clear to auscultation bilaterally Cardiovascular: regular rate and rhythm Breasts: normal appearance, no masses or tenderness Abdomen: soft, non-tender; non distended,  no masses,  no organomegaly Extremities: extremities normal, atraumatic, no cyanosis or edema Skin: Skin color, texture, turgor normal. No rashes or lesions Lymph nodes: Cervical, supraclavicular, and axillary nodes normal. No abnormal inguinal nodes palpated Neurologic: Grossly normal   Pelvic: External genitalia:  no lesions              Urethra:  normal appearing urethra with no masses, tenderness or lesions              Bartholins and Skenes: normal                 Vagina: normal appearing vagina with normal color and discharge, no lesions              Cervix: absent               Bimanual Exam:  Uterus:  uterus absent              Adnexa: no mass, fullness, tenderness               Rectovaginal: Confirms               Anus:  normal sphincter tone, no lesions  Chaperone was present for exam.  A:  Well Woman with normal exam  Tolerable, mild GSI  Having chest tightness, shaking, headaches since May.   No menopausal c/o  Family history of breast cancer  P:   No pap needed  Discussed breast self exam  Discussed calcium and vit D intake  Mammogram due  Colonoscopy UTD  Being evaluated by Dr Yetta Barre for her chest and neurologic c/o

## 2018-03-29 ENCOUNTER — Encounter (HOSPITAL_COMMUNITY): Payer: Self-pay | Admitting: *Deleted

## 2018-03-29 ENCOUNTER — Emergency Department (HOSPITAL_COMMUNITY)
Admission: EM | Admit: 2018-03-29 | Discharge: 2018-03-30 | Disposition: A | Discharge status: Home / Self Care | Payer: 59 | Attending: Emergency Medicine | Admitting: Emergency Medicine

## 2018-03-29 ENCOUNTER — Other Ambulatory Visit: Payer: Self-pay

## 2018-03-29 ENCOUNTER — Emergency Department (HOSPITAL_COMMUNITY): Payer: 59

## 2018-03-29 DIAGNOSIS — F17210 Nicotine dependence, cigarettes, uncomplicated: Secondary | ICD-10-CM | POA: Diagnosis not present

## 2018-03-29 DIAGNOSIS — I10 Essential (primary) hypertension: Secondary | ICD-10-CM | POA: Insufficient documentation

## 2018-03-29 DIAGNOSIS — R0600 Dyspnea, unspecified: Secondary | ICD-10-CM | POA: Diagnosis present

## 2018-03-29 DIAGNOSIS — I493 Ventricular premature depolarization: Secondary | ICD-10-CM | POA: Insufficient documentation

## 2018-03-29 DIAGNOSIS — Z79899 Other long term (current) drug therapy: Secondary | ICD-10-CM | POA: Insufficient documentation

## 2018-03-29 DIAGNOSIS — R002 Palpitations: Secondary | ICD-10-CM

## 2018-03-29 NOTE — ED Notes (Signed)
Patient transported to X-ray 

## 2018-03-29 NOTE — ED Triage Notes (Signed)
Pt stated "I've been going to the doctor for my b/p and he's been changing my pills.  Today I've not taken any b/p medicine because I've just felt real stupid all day."

## 2018-03-29 NOTE — ED Notes (Signed)
Pt explained to this RN that she has not felt right ever since her CT scan in the spring. She explained that she gets "a cramp in the back of the neck that creates a pressure in my head which makes it feel like it is going to blow up"

## 2018-03-29 NOTE — ED Provider Notes (Signed)
[facility]-EMERGENCY DEPT Provider Note  CSN: [id number] Arrival date & time: 03/29/18 2107  Chief Complaint(s) Medication Reaction and Shortness of Breath  HPI Brittany Moreno is a 62 y.o. female with a history of hypertension and hyperlipidemia presents to the emergency department with several months of intermittent episodes of flushing, heart thumping, and dyspnea which last seconds to a couple minutes at a time.  She reports that they have become more frequent in the past week after being placed on chlorthalidone for her blood pressure.  However these episodes began after being placed on losartan and she feels they are related to the losartan itself.  She does endorse feeling chest tightness with these episodes however it typically is not every episode.  She also feels lightheaded.  Endorses nausea after these episodes.  Denies any recent fevers or infections.  No emesis.  Denies any dyspnea on exertion.  No lower extremity swelling.  No abdominal pain or diarrhea.  Patient reports that she has had work-up by her primary care doctor including thyroid studies.  She recently had a stress test on October 10 which was negative.  The history is provided by the patient.   Past Medical History Past Medical History:  Diagnosis Date  . High cholesterol   . Hypertension    There are no active problems to display for this patient.  Home Medication(s) Prior to Admission medications   Medication Sig Start Date End Date Taking? Authorizing Provider  losartan (COZAAR) 100 MG tablet Take 100 mg by mouth daily.    [provider]  simvastatin (ZOCOR) 40 MG tablet Take 40 mg by mouth daily.    [provider]                                                                                                                                    Past Surgical History Past Surgical History:  Procedure Laterality Date  . ABDOMINAL HYSTERECTOMY    . CARPAL TUNNEL  RELEASE    . MANDIBLE SURGERY    . ROTATOR CUFF REPAIR    . TUBAL LIGATION     Family History Family History  Problem Relation Age of Onset  . Hypertension Mother   . Diabetes Mother   . Hypertension Father   . Aneurysm Father   . Hypertension Sister   . Diabetes Sister   . Breast cancer Sister   . Kidney cancer Sister   . Crohn's disease Sister   . Anuerysm Sister     Social History Social History   Tobacco Use  . Smoking status: Current Every Day Smoker    Packs/day: 1.00    Types: Cigarettes  . Smokeless tobacco: Never Used  Substance Use Topics  . Alcohol use: Yes    Alcohol/week: 12.0 standard drinks    Types: 12 Standard drinks or equivalent per week    Comment: daily drinker  . Drug use:  No   Allergies Erythromycin; Penicillins; and Prednisone  Review of Systems Review of Systems All other systems are reviewed and are negative for acute change except as noted in the HPI  Physical Exam Vital Signs  I have reviewed the triage vital signs BP (!) 149/102 (BP Location: Right Arm)   Pulse 99   Temp 97.9 F (36.6 C) (Oral)   Resp 18   Ht 5\' 4"  (1.626 m)   Wt 73.9 kg   SpO2 98%   BMI 27.98 kg/m   Physical Exam  Constitutional: She is oriented to person, place, and time. She appears well-developed and well-nourished. No distress.  HENT:  Head: Normocephalic and atraumatic.  Nose: Nose normal.  Eyes: Pupils are equal, round, and reactive to light. Conjunctivae and EOM are normal. Right eye exhibits no discharge. Left eye exhibits no discharge. No scleral icterus.  Neck: Normal range of motion. Neck supple.  Cardiovascular: Normal rate and regular rhythm. Exam reveals no gallop and no friction rub.  No murmur heard. Pulmonary/Chest: Effort normal and breath sounds normal. No stridor. No respiratory distress. She has no rales.  Abdominal: Soft. She exhibits no distension. There is no tenderness.  Musculoskeletal: She exhibits no edema or tenderness.    Neurological: She is alert and oriented to person, place, and time.  Skin: Skin is warm and dry. No rash noted. She is not diaphoretic. No erythema.  Psychiatric: She has a normal mood and affect.  Vitals reviewed.   ED Results and Treatments Labs (all labs ordered are listed, but only abnormal results are displayed) Labs Reviewed  COMPREHENSIVE METABOLIC PANEL - Abnormal; Notable for the following components:      Result Value   Glucose, Bld 111 (*)    All other components within normal limits  CBC  MAGNESIUM  POCT I-STAT TROPONIN I                                                                                                                         EKG  EKG Interpretation  Date/Time:  Sunday March 29 2018 21:48:04 EDT Ventricular Rate:  94 PR Interval:    QRS Duration: 105 QT Interval:  349 QTC Calculation: 437 R Axis:   31 Text Interpretation:  Sinus rhythm Probable left atrial enlargement Minimal ST depression, lateral leads No significant change since last tracing Confirmed by Drema Pry [phone]) on 03/30/2018 12:34:38 AM      Radiology Dg Chest 2 View  Result Date: 03/29/2018 CLINICAL DATA:  62 year old female with shortness of breath. EXAM: CHEST - 2 VIEW COMPARISON:  Chest radiograph dated 09/12/2015 FINDINGS: There is no focal consolidation, pleural effusion, or pneumothorax. Mild background of emphysema. The cardiac silhouette is within normal limits. No acute osseous pathology. IMPRESSION: No active cardiopulmonary disease. Electronically Signed   By: Elgie Collard M.D.   On: 03/29/2018 22:59   Pertinent labs & imaging results that were available during my care of the patient were reviewed by me and  considered in my medical decision making (see chart for details).  Medications Ordered in ED Medications - No data to display                                                                                                                                   Procedures Procedures  (including critical care time)  Medical Decision Making / ED Course I have reviewed the nursing notes for this encounter and the patient's prior records (if available in EHR or on provided paperwork).    During my evaluation, the patient had another episode.  I noted on telemetry that she had intermittent PVCs during this episode which lasted approximately 20 seconds.  It appears that her symptoms are related to this.  She did not have any chest pain during this time.  Her EKG is unchanged from prior and showed no evidence of acute ischemic changes, pericarditis or dysrhythmias.  Screening labs obtain and revealed no significant electrolyte derangements, renal insufficiency, and anemia.  Troponin was negative.  Low suspicion for ACS, PE.  Recommended follow-up with her cardiologist.   The patient appears reasonably screened and/or stabilized for discharge and I doubt any other medical condition or other [facility] requiring further screening, evaluation, or treatment in the ED at this time prior to discharge.  The patient is safe for discharge with strict return precautions.   Final Clinical Impression(s) / ED Diagnoses Final diagnoses:  Intermittent palpitations  PVC (premature ventricular contraction)   Disposition: Discharge  Condition: Good  I have discussed the results, Dx and Tx plan with the patient who expressed understanding and agree(s) with the plan. Discharge instructions discussed at great length. The patient was given strict return precautions who verbalized understanding of the instructions. No further questions at time of discharge.    ED Discharge Orders    None       Follow Up: Knox Royalty, MD [address] [place] [address] [phone]  Schedule an appointment as soon as possible for a visit        This chart was dictated using voice recognition software.  Despite best efforts to proofread,  errors can occur which can  change the documentation meaning.   Nira Conn, MD 03/30/18 [phone]

## 2018-03-30 LAB — COMPREHENSIVE METABOLIC PANEL
ALK PHOS: 57 U/L (ref 38–126)
ALT: 23 U/L (ref 0–44)
ANION GAP: 11 (ref 5–15)
AST: 22 U/L (ref 15–41)
Albumin: 4.3 g/dL (ref 3.5–5.0)
BILIRUBIN TOTAL: 0.6 mg/dL (ref 0.3–1.2)
BUN: 16 mg/dL (ref 8–23)
CALCIUM: 9.2 mg/dL (ref 8.9–10.3)
CO2: 25 mmol/L (ref 22–32)
CREATININE: 0.87 mg/dL (ref 0.44–1.00)
Chloride: 103 mmol/L (ref 98–111)
Glucose, Bld: 111 mg/dL — ABNORMAL HIGH (ref 70–99)
Potassium: 3.5 mmol/L (ref 3.5–5.1)
SODIUM: 139 mmol/L (ref 135–145)
TOTAL PROTEIN: 7.2 g/dL (ref 6.5–8.1)

## 2018-03-30 LAB — POCT I-STAT TROPONIN I: TROPONIN I, POC: 0 ng/mL (ref 0.00–0.08)

## 2018-03-30 LAB — CBC
HCT: 41.8 % (ref 36.0–46.0)
HEMOGLOBIN: 14.2 g/dL (ref 12.0–15.0)
MCH: 32.3 pg (ref 26.0–34.0)
MCHC: 34 g/dL (ref 30.0–36.0)
MCV: 95 fL (ref 80.0–100.0)
NRBC: 0 % (ref 0.0–0.2)
Platelets: 263 10*3/uL (ref 150–400)
RBC: 4.4 MIL/uL (ref 3.87–5.11)
RDW: 12.2 % (ref 11.5–15.5)
WBC: 8.8 10*3/uL (ref 4.0–10.5)

## 2018-03-30 LAB — MAGNESIUM: MAGNESIUM: 2.3 mg/dL (ref 1.7–2.4)

## 2018-05-04 ENCOUNTER — Encounter: Payer: Self-pay | Admitting: Neurology

## 2018-05-19 ENCOUNTER — Other Ambulatory Visit: Payer: Self-pay | Admitting: Nurse Practitioner

## 2018-05-19 ENCOUNTER — Encounter: Payer: Self-pay | Admitting: Obstetrics and Gynecology

## 2018-05-19 DIAGNOSIS — R42 Dizziness and giddiness: Secondary | ICD-10-CM

## 2018-05-20 ENCOUNTER — Ambulatory Visit
Admission: RE | Admit: 2018-05-20 | Discharge: 2018-05-20 | Disposition: A | Discharge status: Home / Self Care | Payer: 59 | Source: Ambulatory Visit | Attending: Nurse Practitioner | Admitting: Nurse Practitioner

## 2018-05-20 DIAGNOSIS — R42 Dizziness and giddiness: Secondary | ICD-10-CM

## 2018-05-20 MED ORDER — GADOBENATE DIMEGLUMINE 529 MG/ML IV SOLN
14.0000 mL | Freq: Once | INTRAVENOUS | Status: AC | PRN
  Administered 2018-05-20: 14 mL via INTRAVENOUS

## 2018-06-14 ENCOUNTER — Encounter (HOSPITAL_COMMUNITY): Payer: Self-pay | Admitting: Nurse Practitioner

## 2018-06-14 ENCOUNTER — Emergency Department (HOSPITAL_COMMUNITY)
Admission: EM | Admit: 2018-06-14 | Discharge: 2018-06-14 | Disposition: A | Discharge status: Home / Self Care | Payer: 59 | Attending: Emergency Medicine | Admitting: Emergency Medicine

## 2018-06-14 DIAGNOSIS — Z79899 Other long term (current) drug therapy: Secondary | ICD-10-CM | POA: Diagnosis not present

## 2018-06-14 DIAGNOSIS — I10 Essential (primary) hypertension: Secondary | ICD-10-CM | POA: Diagnosis not present

## 2018-06-14 DIAGNOSIS — F17210 Nicotine dependence, cigarettes, uncomplicated: Secondary | ICD-10-CM | POA: Insufficient documentation

## 2018-06-14 DIAGNOSIS — Z7982 Long term (current) use of aspirin: Secondary | ICD-10-CM | POA: Diagnosis not present

## 2018-06-14 DIAGNOSIS — R21 Rash and other nonspecific skin eruption: Secondary | ICD-10-CM | POA: Diagnosis present

## 2018-06-14 DIAGNOSIS — T7840XA Allergy, unspecified, initial encounter: Secondary | ICD-10-CM | POA: Diagnosis not present

## 2018-06-14 LAB — COMPREHENSIVE METABOLIC PANEL
ALBUMIN: 4.3 g/dL (ref 3.5–5.0)
ALK PHOS: 62 U/L (ref 38–126)
ALT: 31 U/L (ref 0–44)
ANION GAP: 12 (ref 5–15)
AST: 26 U/L (ref 15–41)
BILIRUBIN TOTAL: 0.5 mg/dL (ref 0.3–1.2)
BUN: 17 mg/dL (ref 8–23)
CO2: 20 mmol/L — AB (ref 22–32)
CREATININE: 0.72 mg/dL (ref 0.44–1.00)
Calcium: 9.2 mg/dL (ref 8.9–10.3)
Chloride: 108 mmol/L (ref 98–111)
GFR calc Af Amer: >60 mL/min (ref >60)
GFR calc non Af Amer: >60 mL/min (ref >60)
GLUCOSE: 142 mg/dL — AB (ref 70–99)
Potassium: 3.6 mmol/L (ref 3.5–5.1)
Sodium: 140 mmol/L (ref 135–145)
TOTAL PROTEIN: 6.9 g/dL (ref 6.5–8.1)

## 2018-06-14 LAB — CBC WITH DIFFERENTIAL/PLATELET
Abs Immature Granulocytes: 0.12 10*3/uL — ABNORMAL HIGH (ref 0.00–0.07)
BASOS PCT: 0 %
Basophils Absolute: 0 10*3/uL (ref 0.0–0.1)
EOS ABS: 0.1 10*3/uL (ref 0.0–0.5)
Eosinophils Relative: 0 %
HEMATOCRIT: 43.2 % (ref 36.0–46.0)
Hemoglobin: 14 g/dL (ref 12.0–15.0)
Immature Granulocytes: 1 %
LYMPHS ABS: 2 10*3/uL (ref 0.7–4.0)
Lymphocytes Relative: 14 %
MCH: 31.7 pg (ref 26.0–34.0)
MCHC: 32.4 g/dL (ref 30.0–36.0)
MCV: 98 fL (ref 80.0–100.0)
MONOS PCT: 4 %
Monocytes Absolute: 0.6 10*3/uL (ref 0.1–1.0)
NEUTROS PCT: 81 %
Neutro Abs: 11 10*3/uL — ABNORMAL HIGH (ref 1.7–7.7)
PLATELETS: 204 10*3/uL (ref 150–400)
RBC: 4.41 MIL/uL (ref 3.87–5.11)
RDW: 12.6 % (ref 11.5–15.5)
WBC: 13.8 10*3/uL — ABNORMAL HIGH (ref 4.0–10.5)
nRBC: 0 % (ref 0.0–0.2)

## 2018-06-14 MED ORDER — FAMOTIDINE IN NACL 20-0.9 MG/50ML-% IV SOLN
20.0000 mg | Freq: Once | INTRAVENOUS | Status: AC
  Administered 2018-06-14: 20 mg via INTRAVENOUS
  Filled 2018-06-14: qty 50

## 2018-06-14 MED ORDER — EPINEPHRINE 0.3 MG/0.3ML IJ SOAJ: 0.3000 mg | Freq: Once | INTRAMUSCULAR | 0 refills | Status: AC

## 2018-06-14 MED ORDER — FAMOTIDINE 20 MG PO TABS: 20.0000 mg | ORAL_TABLET | Freq: Two times a day (BID) | ORAL | 0 refills | Status: DC | PRN

## 2018-06-14 NOTE — Progress Notes (Signed)
[facility] Clinic Note - Initial Visit   Date: 06/15/18  Brittany Moreno MRN: [id number] DOB: 02-28-1956   Dear Dr. Massie Maroon:  Thank you for your kind referral of Brittany Moreno for consultation of dizziness. Although her history is well known to you, please allow [place] to reiterate it for the purpose of our medical record. The patient was accompanied to the clinic by self.    History of Present Illness: Brittany Moreno is a 63 y.o. right-handed Caucasian female with hypertension, hyperlipidemia, PVCs, tobacco abuse, gastritis, anxiety presenting for evaluation of dizziness.    She complains of lightheadedness and buzzing sound in her head which started around the summer 2019. Her blood pressure was elevated and each time her medication is increased, she feels more lightheadedness.  There are no specific triggers, it can occur at rest or with exertions.  Symptoms are intermittent occurring about 3 times per week, usually always in the morning.  They were occurring more frequently when she was on Losartan, which has been discontinued.  She start nebivolol 6 weeks ago, which has made buzzing worse. She also complains of buzzing in her head, which is constant and improved by [facility] powder.  She has not had any falls and denies weakness, vision changes, headaches, exertional symptoms, or paresthesias.   Previous evaluation has included contrasted MRI brain, EKG, echocardiogram, cardiac stress test, and ultrasound carotids which is normal.  She was started on topiramate 25 mg daily for possible atypical migraine, which she did not take.  She went to the emergency room on 03/29/2018 for these symptoms and had documented PVCs associated with her lightheadedness.  She was saw her cardiologist, Dr. Tally Due, who did not feel that she needed a cardiac event monitor.  She has family history of cerebral aneurysm in her father (ruptured, SAH) and sister (2 aneurysms, one ruptured).  MRI/A/V  head in 2012 was personally reviewed and is normal, specifically no evidence of aneurysm.   Out-side paper records, electronic medical record, and images have been reviewed where available and summarized as:  MRI brain wwo contrast 05/20/2018:  Negative MRI head with contrast. MRI/A head 08/25/2010:  Unremarkable MRI head without contrast.  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  Negative MRA head.  Negative MRV.   Past Medical History:  Diagnosis Date  . High cholesterol   . Hypertension     Past Surgical History:  Procedure Laterality Date  . ABDOMINAL HYSTERECTOMY    . CARPAL TUNNEL RELEASE    . MANDIBLE SURGERY    . ROTATOR CUFF REPAIR    . TUBAL LIGATION       Medications:  Outpatient Encounter Medications as of 06/15/2018  Medication Sig Note  . amLODipine (NORVASC) 5 MG tablet Take 5 mg by mouth daily.   Marland Kitchen aspirin EC 81 MG tablet Take 81 mg by mouth daily.   . famotidine (PEPCID) 20 MG tablet Take 1 tablet (20 mg total) by mouth 2 (two) times daily as needed (Itching, rash).   . nebivolol (BYSTOLIC) 10 MG tablet Take 10 mg by mouth at bedtime.  06/14/2018: Takes with 5mg  for total dose of 15mg  at bedtime  . nebivolol (BYSTOLIC) 5 MG tablet Take 5 mg by mouth daily. 06/14/2018: Takes with 10mg  for total dose of 15mg  at bedtime  . NON FORMULARY Place 1 drop under the tongue 2 (two) times daily. CBD oil   . [DISCONTINUED] losartan (COZAAR) 100 MG tablet Take 100 mg by mouth daily.   . [  DISCONTINUED] simvastatin (ZOCOR) 40 MG tablet Take 40 mg by mouth daily.    No facility-administered encounter medications on file as of 06/15/2018.      Allergies:  Allergies  Allergen Reactions  . Bactrim [Sulfamethoxazole-Trimethoprim] Shortness Of Breath and Rash  . Erythromycin   . Penicillins   . Prednisone     Family History: Family History  Problem Relation Age of Onset  . Hypertension Mother   . Diabetes Mother   . Hypertension Father   . Aneurysm Father     . Hypertension Sister   . Diabetes Sister   . Breast cancer Sister   . Kidney cancer Sister   . Crohn's disease Sister   . Anuerysm Sister     Social History: Social History   Tobacco Use  . Smoking status: Current Every Day Smoker    Packs/day: 1.00    Types: Cigarettes  . Smokeless tobacco: Never Used  Substance Use Topics  . Alcohol use: Yes    Alcohol/week: 12.0 standard drinks    Types: 12 Standard drinks or equivalent per week    Comment: daily drinker  . Drug use: No   Social History   Social History Narrative   Lives with husband and 2 granddaughters in a 2 story home.  Retired Biochemist, clinical.  Has 2 children.  Education: high school.      Review of Systems:  CONSTITUTIONAL: No fevers, chills, night sweats, or weight loss.   EYES: No visual changes or eye pain ENT: +hearing changes.  No history of nose bleeds.   RESPIRATORY: No cough, wheezing and shortness of breath.   CARDIOVASCULAR: Negative for chest pain, +palpitations.   GI: Negative for abdominal discomfort, blood in stools or black stools.  No recent change in bowel habits.   GU:  No history of incontinence.   MUSCLOSKELETAL: No history of joint pain or swelling.  No myalgias.   SKIN: Negative for lesions, rash, and itching.   HEMATOLOGY/ONCOLOGY: Negative for prolonged bleeding, bruising easily, and swollen nodes.  No history of cancer.   ENDOCRINE: Negative for cold or heat intolerance, polydipsia or goiter.   PSYCH:  No depression or anxiety symptoms.   NEURO: As Above.   Vital Signs:  BP 120/78   Pulse 77   Ht 5\' 4"  (1.626 m)   Wt 166 lb (75.3 kg)   SpO2 98%   BMI 28.49 kg/m    General Medical Exam:   General:  Well appearing, comfortable.   Eyes/ENT: see cranial nerve examination.   Neck: No masses appreciated.  Full range of motion without tenderness.  No carotid bruits. Respiratory:  Clear to auscultation, good air entry bilaterally.   Cardiac:  Regular rate and rhythm, no murmur.    Extremities:  No deformities, edema, or skin discoloration.  Skin:  No rashes or lesions.  Neurological Exam: MENTAL STATUS including orientation to time, place, person, recent and remote memory, attention span and concentration, language, and fund of knowledge is normal.  Speech is not dysarthric.  CRANIAL NERVES: II:  No visual field defects.  Unremarkable fundi.   III-IV-VI: Pupils equal round and reactive to light.  Normal conjugate, extra-ocular eye movements in all directions of gaze.  No nystagmus.  No ptosis.   V:  Normal facial sensation.  VII:  Normal facial symmetry and movements.  VIII:  Normal hearing and vestibular function.   IX-X:  Normal palatal movement.   XI:  Normal shoulder shrug and head rotation.   XII:  Normal tongue strength and range of motion, no deviation or fasciculation.  MOTOR:  No atrophy, fasciculations or abnormal movements.  No pronator drift.  Tone is normal.    Right Upper Extremity:    Left Upper Extremity:    Deltoid  5/5   Deltoid  5/5   Biceps  5/5   Biceps  5/5   Triceps  5/5   Triceps  5/5   Wrist extensors  5/5   Wrist extensors  5/5   Wrist flexors  5/5   Wrist flexors  5/5   Finger extensors  5/5   Finger extensors  5/5   Finger flexors  5/5   Finger flexors  5/5   Dorsal interossei  5/5   Dorsal interossei  5/5   Abductor pollicis  5/5   Abductor pollicis  5/5   Tone (Ashworth scale)  0  Tone (Ashworth scale)  0   Right Lower Extremity:    Left Lower Extremity:    Hip flexors  5/5   Hip flexors  5/5   Hip extensors  5/5   Hip extensors  5/5   Knee flexors  5/5   Knee flexors  5/5   Knee extensors  5/5   Knee extensors  5/5   Dorsiflexors  5/5   Dorsiflexors  5/5   Plantarflexors  5/5   Plantarflexors  5/5   Toe extensors  5/5   Toe extensors  5/5   Toe flexors  5/5   Toe flexors  5/5   Tone (Ashworth scale)  0  Tone (Ashworth scale)  0   MSRs:  Right                                                                  Left brachioradialis 2+  brachioradialis 2+  biceps 2+  biceps 2+  triceps 2+  triceps 2+  patellar 2+  patellar 2+  ankle jerk 1+  ankle jerk 1+  Hoffman no  Hoffman no  plantar response down  plantar response down   SENSORY:  Normal and symmetric perception of light touch, pinprick, vibration, and proprioception.  Romberg's sign mildly abnormal.   COORDINATION/GAIT: Normal finger-to- nose-finger and heel-to-shin.  Intact rapid alternating movements bilaterally.  Gait narrow based and stable. Tandem and stressed gait intact.    IMPRESSION: Brittany Moreno is a pleasant 63 year-old female referred for evaluation of lightheadedness and tinnitus.  Her neurological exam is essentially nonfocal.  MRI brain from December 2019 was personally reviewed and does not show any structural abnormalities.  To be complete in her evaluation and given her family history of cerebral aneurysm in two family members, I will check MRA head.  If imaging is negative, she may need cardiac event monitor and ENT evaluation.  Further recommendations pending results.   Thank you for allowing me to participate in patient's care.  If I can answer any additional questions, I would be pleased to do so.    Sincerely,    Adriell Polansky K. Allena KatzPatel, DO

## 2018-06-14 NOTE — ED Notes (Signed)
Bed: HQ46 Expected date:  Expected time:  Means of arrival:  Comments: Allergic Reaction

## 2018-06-14 NOTE — ED Triage Notes (Signed)
Pt is presented by EMS with c/o allergic reaction to Bactrim which occurred about 1600hrs. She has generalized hives and reports throat discomfort at some point. She received a round of epi from EMS and 50 mg of IV benadryl. No obvious signs of distress at this time.

## 2018-06-14 NOTE — Discharge Instructions (Addendum)
Pepcid and Benadryl as needed.   Follow up with your doctor if symptoms are not improving after 3 days. You should return to the emergency department if symptoms worsen, become progressive, or become more concerning.  SEEK IMMEDIATE MEDICAL CARE IF:  You develop difficulty breathing or wheezing, or have a tight feeling in your chest or throat (feeling like your throat is closing) You develop swollen lips or tongue You are unable to swallow fluids or salvia secretions.   I have given you a prescription for an Epi Pen. If any of the above symptoms occur, use it and call 911.   A severe reaction with any of the above problems should be considered life-threatening. If you suddenly develop difficulty breathing call for local emergency medical help. THIS IS AN EMERGENCY.

## 2018-06-14 NOTE — ED Provider Notes (Signed)
[facility]-EMERGENCY DEPT Provider Note   CSN: [id number] Arrival date & time: 06/14/18  1847     History   Chief Complaint Chief Complaint  Patient presents with  . Allergic Reaction    HPI Brittany Moreno is a 63 y.o. female.  The history is provided by the patient and medical records. No language interpreter was used.  Allergic Reaction  Presenting symptoms: rash   Presenting symptoms: no difficulty swallowing and no wheezing    Brittany Moreno is a 63 y.o. female  with a PMH of HTN, HLD who presents to the Emergency Department via EMS for concerns of allergic reaction.  Patient states that she has been struggling with a sinus infection for the last week and has been on doxycycline.  She finished this yesterday.  She did not feel like she was completely better, therefore she took 1 of her leftover Bactrim from a different prescription.  A few minutes after taking this, she started feeling some tightness in her neck as well as shortness of breath.  She used her husband's albuterol nebulizer machine which provided some relief and then called 911.  When EMS arrived, they noticed a red rash diffusely to her body.  They gave her 50 of Benadryl as well as epinephrine.  In route, they felt as if she had been getting better.  Currently, patient feels as if the tightness in her neck has resolved.  Her breathing is much better.  She is no longer itching, but does still have the rash.  She denies having a reaction to Bactrim in the past.  She denies any other new exposures such as lotions, detergents, etc.  No other changes in her medications recently.  Past Medical History:  Diagnosis Date  . High cholesterol   . Hypertension     There are no active problems to display for this patient.   Past Surgical History:  Procedure Laterality Date  . ABDOMINAL HYSTERECTOMY    . CARPAL TUNNEL RELEASE    . MANDIBLE SURGERY    . ROTATOR CUFF REPAIR    . TUBAL LIGATION        OB History    Gravida  2   Para  2   Term  2   Preterm  0   AB  0   Living  2     SAB      TAB      Ectopic      Multiple      Live Births  2            Home Medications    Prior to Admission medications   Medication Sig Start Date End Date Taking? Authorizing Provider  amLODipine (NORVASC) 5 MG tablet Take 5 mg by mouth daily.   Yes [provider]  aspirin EC 81 MG tablet Take 81 mg by mouth daily.   Yes [provider]  nebivolol (BYSTOLIC) 10 MG tablet Take 10 mg by mouth at bedtime.    Yes [provider]  nebivolol (BYSTOLIC) 5 MG tablet Take 5 mg by mouth daily.   Yes [provider]  NON FORMULARY Place 1 drop under the tongue 2 (two) times daily. CBD oil   Yes [provider]  EPINEPHrine (EPIPEN 2-PAK) 0.3 mg/0.3 mL IJ SOAJ injection Inject 0.3 mLs (0.3 mg total) into the muscle once for 1 dose. 06/14/18 06/14/18  Cissy Galbreath, Chase Picket, PA-C  famotidine (PEPCID) 20 MG tablet Take 1  tablet (20 mg total) by mouth 2 (two) times daily as needed (Itching, rash). 06/14/18   Selestino Nila, Chase PicketJaime Pilcher, PA-C    Family History Family History  Problem Relation Age of Onset  . Hypertension Mother   . Diabetes Mother   . Hypertension Father   . Aneurysm Father   . Hypertension Sister   . Diabetes Sister   . Breast cancer Sister   . Kidney cancer Sister   . Crohn's disease Sister   . Anuerysm Sister     Social History Social History   Tobacco Use  . Smoking status: Current Every Day Smoker    Packs/day: 1.00    Types: Cigarettes  . Smokeless tobacco: Never Used  Substance Use Topics  . Alcohol use: Yes    Alcohol/week: 12.0 standard drinks    Types: 12 Standard drinks or equivalent per week    Comment: daily drinker  . Drug use: No     Allergies   Bactrim [sulfamethoxazole-trimethoprim]; Erythromycin; Penicillins; and Prednisone   Review of Systems Review of Systems  HENT: Negative for sore throat,  trouble swallowing and voice change.   Respiratory: Positive for shortness of breath. Negative for cough and wheezing.   Skin: Positive for rash.  All other systems reviewed and are negative.    Physical Exam Updated Vital Signs BP 123/74   Pulse 64   Temp 98.1 F (36.7 C) (Oral)   Resp 14   SpO2 96%   Physical Exam Vitals signs and nursing note reviewed.  Constitutional:      General: She is not in acute distress.    Appearance: She is well-developed.  HENT:     Head: Normocephalic and atraumatic.     Mouth/Throat:     Comments: No oral lesions. Cardiovascular:     Rate and Rhythm: Normal rate and regular rhythm.     Heart sounds: Normal heart sounds. No murmur.  Pulmonary:     Effort: Pulmonary effort is normal. No respiratory distress.     Breath sounds: Normal breath sounds.     Comments: No wheezing. Abdominal:     General: There is no distension.     Palpations: Abdomen is soft.     Tenderness: There is no abdominal tenderness.  Skin:    General: Skin is warm and dry.     Comments: Diffuse erythematous rash.  Neurological:     Mental Status: She is alert and oriented to person, place, and time.      ED Treatments / Results  Labs (all labs ordered are listed, but only abnormal results are displayed) Labs Reviewed  COMPREHENSIVE METABOLIC PANEL - Abnormal; Notable for the following components:      Result Value   CO2 20 (*)    Glucose, Bld 142 (*)    All other components within normal limits  CBC WITH DIFFERENTIAL/PLATELET - Abnormal; Notable for the following components:   WBC 13.8 (*)    Neutro Abs 11.0 (*)    Abs Immature Granulocytes 0.12 (*)    All other components within normal limits    EKG None  Radiology No results found.  Procedures Procedures (including critical care time)  Medications Ordered in ED Medications  famotidine (PEPCID) IVPB 20 mg premix (0 mg Intravenous Stopped 06/14/18 2018)     Initial Impression / Assessment  and Plan / ED Course  I have reviewed the triage vital signs and the nursing notes.  Pertinent labs & imaging results that were available during my care  of the patient were reviewed by me and considered in my medical decision making (see chart for details).    Brittany Moreno is a 63 y.o. female who presents to ED for allergic reaction just prior to arrival.  This began a few minutes after she took Bactrim.  She developed a red rash diffusely to her entire body.  Initially had some shortness of breath which she notes subsided after using her husband's nebulizer machine.  She was experiencing some tightness in her throat, therefore when EMS arrived, they gave her at the as well as 50 mg of Benadryl.  Per EMS, rash does seem to be improved compared to when they were called to the scene.  On my initial examination, she is afebrile, hemodynamically stable with clear lung exam and patent airway.  She has no oral lesions or sloughing of the skin to suggest SJS. Patient states that she is allergic to steroids.  She notes that they make her head start ringing as if it is going to explode.  Does not want Solu-Medrol.  Will give Pepcid and monitor closely.  8:46 PM -on reevaluation, patient much improved.  Rash is resolving.  We will continue to monitor given she had epinephrine in the field.  Anticipate home with Benadryl/Pepcid and EpiPen prescription.  10:39 PM - Patient again reevaluated.  Rash is nearly resolved.  Lungs are still clear.  Airway still patent.  She feels very much improved and comfortable with discharge to home.  Given prescription for EpiPen.  Encouraged Benadryl/Pepcid use at home.  We discussed reasons to return to the emergency department and indications for EpiPen use at length.  All questions were answered.  Patient discussed with Dr. Rush Landmark who agrees with treatment plan.    Final Clinical Impressions(s) / ED Diagnoses   Final diagnoses:  Allergic reaction, initial encounter     ED Discharge Orders         Ordered    famotidine (PEPCID) 20 MG tablet  2 times daily PRN     06/14/18 2242    EPINEPHrine (EPIPEN 2-PAK) 0.3 mg/0.3 mL IJ SOAJ injection   Once     06/14/18 2242           Cataldo Cosgriff, Chase Picket, PA-C 06/14/18 2255    Tegeler, Canary Brim, MD 06/15/18 [phone]

## 2018-06-15 ENCOUNTER — Ambulatory Visit: Payer: 59 | Admitting: Neurology

## 2018-06-15 ENCOUNTER — Encounter: Payer: Self-pay | Admitting: Neurology

## 2018-06-15 VITALS — BP 120/78 | HR 77 | Ht 64.0 in | Wt 166.0 lb

## 2018-06-15 DIAGNOSIS — R42 Dizziness and giddiness: Secondary | ICD-10-CM | POA: Diagnosis not present

## 2018-06-15 DIAGNOSIS — H9313 Tinnitus, bilateral: Secondary | ICD-10-CM | POA: Diagnosis not present

## 2018-06-15 NOTE — Patient Instructions (Signed)
We will check MRA head and call you with the results

## 2018-06-21 ENCOUNTER — Ambulatory Visit
Admission: RE | Admit: 2018-06-21 | Discharge: 2018-06-21 | Disposition: A | Discharge status: Home / Self Care | Payer: 59 | Source: Ambulatory Visit | Attending: Neurology | Admitting: Neurology

## 2018-06-21 DIAGNOSIS — R42 Dizziness and giddiness: Secondary | ICD-10-CM

## 2018-06-22 ENCOUNTER — Encounter: Payer: Self-pay | Admitting: *Deleted

## 2018-06-22 ENCOUNTER — Telehealth: Payer: Self-pay | Admitting: *Deleted

## 2018-06-22 NOTE — Telephone Encounter (Signed)
Results and instructions sent via My Chart.  

## 2018-06-22 NOTE — Telephone Encounter (Signed)
-----   Message from Glendale Chard, DO sent at 06/22/2018  9:06 AM EST ----- Please inform patient that her MRA looking at her blood vessels is normal - no evidence of aneurysm or anything worrisome causing her symptoms.  I recommend that she follow-up with there cardiologist for event monitor and consider seeing ENT. Thanks.

## 2018-10-27 ENCOUNTER — Ambulatory Visit: Payer: 59 | Admitting: Cardiovascular Disease

## 2019-01-04 ENCOUNTER — Emergency Department (HOSPITAL_COMMUNITY): Payer: 59

## 2019-01-04 ENCOUNTER — Encounter (HOSPITAL_COMMUNITY): Payer: Self-pay

## 2019-01-04 ENCOUNTER — Emergency Department (HOSPITAL_COMMUNITY)
Admission: EM | Admit: 2019-01-04 | Discharge: 2019-01-04 | Disposition: A | Discharge status: Home / Self Care | Payer: 59 | Attending: Emergency Medicine | Admitting: Emergency Medicine

## 2019-01-04 ENCOUNTER — Other Ambulatory Visit: Payer: Self-pay

## 2019-01-04 DIAGNOSIS — R0789 Other chest pain: Secondary | ICD-10-CM | POA: Insufficient documentation

## 2019-01-04 DIAGNOSIS — Z79899 Other long term (current) drug therapy: Secondary | ICD-10-CM | POA: Insufficient documentation

## 2019-01-04 DIAGNOSIS — I10 Essential (primary) hypertension: Secondary | ICD-10-CM | POA: Insufficient documentation

## 2019-01-04 DIAGNOSIS — Z7982 Long term (current) use of aspirin: Secondary | ICD-10-CM | POA: Diagnosis not present

## 2019-01-04 DIAGNOSIS — R079 Chest pain, unspecified: Secondary | ICD-10-CM | POA: Diagnosis present

## 2019-01-04 DIAGNOSIS — F17210 Nicotine dependence, cigarettes, uncomplicated: Secondary | ICD-10-CM | POA: Diagnosis not present

## 2019-01-04 LAB — D-DIMER, QUANTITATIVE: D-Dimer, Quant: <0.27 ug/mL-FEU (ref 0.00–0.50)

## 2019-01-04 LAB — BASIC METABOLIC PANEL
Anion gap: 10 (ref 5–15)
BUN: 10 mg/dL (ref 8–23)
CO2: 25 mmol/L (ref 22–32)
Calcium: 9.5 mg/dL (ref 8.9–10.3)
Chloride: 101 mmol/L (ref 98–111)
Creatinine, Ser: 0.8 mg/dL (ref 0.44–1.00)
GFR calc Af Amer: >60 mL/min (ref >60)
GFR calc non Af Amer: >60 mL/min (ref >60)
Glucose, Bld: 111 mg/dL — ABNORMAL HIGH (ref 70–99)
Potassium: 3.4 mmol/L — ABNORMAL LOW (ref 3.5–5.1)
Sodium: 136 mmol/L (ref 135–145)

## 2019-01-04 LAB — TROPONIN I (HIGH SENSITIVITY)
Troponin I (High Sensitivity): 4 ng/L (ref <18)
Troponin I (High Sensitivity): 3 ng/L (ref <18)

## 2019-01-04 LAB — CBC
HCT: 43.5 % (ref 36.0–46.0)
Hemoglobin: 14.4 g/dL (ref 12.0–15.0)
MCH: 31.8 pg (ref 26.0–34.0)
MCHC: 33.1 g/dL (ref 30.0–36.0)
MCV: 96 fL (ref 80.0–100.0)
Platelets: 289 10*3/uL (ref 150–400)
RBC: 4.53 MIL/uL (ref 3.87–5.11)
RDW: 12.6 % (ref 11.5–15.5)
WBC: 7.2 10*3/uL (ref 4.0–10.5)
nRBC: 0 % (ref 0.0–0.2)

## 2019-01-04 NOTE — ED Notes (Signed)
EDP notified that pt would like an update on plan of care.

## 2019-01-04 NOTE — ED Notes (Signed)
IV removal site bleeding through bandage. New bandage applied to site; bleeding controlled.

## 2019-01-04 NOTE — ED Notes (Signed)
Patient ambulated to and from bathroom with steady gait.

## 2019-01-04 NOTE — ED Provider Notes (Signed)
[facility] Provider Note   CSN: [id number] Arrival date & time: 01/04/19  1434     History   Chief Complaint Chief Complaint  Patient presents with  . Chest Pain    HPI Brittany Moreno is a 63 y.o. female.     836 63-year-old female presents with sudden onset of chest pain or shortness of breath which occurred when she stood up.  States that this lasted for approximately 5 minutes.  No associated syncope or near syncope.  No recent leg pain.  No recent travel history.  No prior history of same.  Patient symptoms resolve spontaneously.  No recent fever cough or congestion.  Patient feels at her baseline now.     Past Medical History:  Diagnosis Date  . High cholesterol   . Hypertension     Patient Active Problem List   Diagnosis Date Noted  . Tinnitus, bilateral 06/15/2018  . Dizziness 06/15/2018    Past Surgical History:  Procedure Laterality Date  . ABDOMINAL HYSTERECTOMY    . CARPAL TUNNEL RELEASE    . MANDIBLE SURGERY    . ROTATOR CUFF REPAIR    . TUBAL LIGATION       OB History    Gravida  2   Para  2   Term  2   Preterm  0   AB  0   Living  2     SAB      TAB      Ectopic      Multiple      Live Births  2            Home Medications    Prior to Admission medications   Medication Sig Start Date End Date Taking? Authorizing Provider  amLODipine (NORVASC) 5 MG tablet Take 5 mg by mouth daily.    [provider]  aspirin EC 81 MG tablet Take 81 mg by mouth daily.    [provider]  famotidine (PEPCID) 20 MG tablet Take 1 tablet (20 mg total) by mouth 2 (two) times daily as needed (Itching, rash). 06/14/18   Ward, Chase PicketJaime Pilcher, PA-C  nebivolol (BYSTOLIC) 10 MG tablet Take 10 mg by mouth at bedtime.     [provider]  nebivolol (BYSTOLIC) 5 MG tablet Take 5 mg by mouth daily.    [provider]  NON FORMULARY Place 1 drop under the tongue 2 (two) times daily.  CBD oil    [provider]    Family History Family History  Problem Relation Age of Onset  . Hypertension Mother   . Diabetes Mother   . Hypertension Father   . Aneurysm Father   . Hypertension Sister   . Diabetes Sister   . Breast cancer Sister   . Kidney cancer Sister   . Crohn's disease Sister   . Anuerysm Sister     Social History Social History   Tobacco Use  . Smoking status: Current Every Day Smoker    Packs/day: 1.00    Types: Cigarettes  . Smokeless tobacco: Never Used  Substance Use Topics  . Alcohol use: Yes    Alcohol/week: 12.0 standard drinks    Types: 12 Standard drinks or equivalent per week    Comment: occ  . Drug use: No     Allergies   Bactrim [sulfamethoxazole-trimethoprim], Erythromycin, Penicillins, and Prednisone   Review of Systems Review of Systems  All other systems reviewed and are negative.  Physical Exam Updated Vital Signs BP 136/84 (BP Location: Right Arm)   Pulse 78   Temp 97.8 F (36.6 C) (Oral)   Resp 14   SpO2 97%   Physical Exam Vitals signs and nursing note reviewed.  Constitutional:      General: She is not in acute distress.    Appearance: Normal appearance. She is well-developed. She is not toxic-appearing.  HENT:     Head: Normocephalic and atraumatic.  Eyes:     General: Lids are normal.     Conjunctiva/sclera: Conjunctivae normal.     Pupils: Pupils are equal, round, and reactive to light.  Neck:     Musculoskeletal: Normal range of motion and neck supple.     Thyroid: No thyroid mass.     Trachea: No tracheal deviation.  Cardiovascular:     Rate and Rhythm: Normal rate and regular rhythm.     Heart sounds: Normal heart sounds. No murmur. No gallop.   Pulmonary:     Effort: Pulmonary effort is normal. No respiratory distress.     Breath sounds: Normal breath sounds. No stridor. No decreased breath sounds, wheezing, rhonchi or rales.  Abdominal:     General: Bowel sounds are normal. There  is no distension.     Palpations: Abdomen is soft.     Tenderness: There is no abdominal tenderness. There is no rebound.  Musculoskeletal: Normal range of motion.        General: No tenderness.  Skin:    General: Skin is warm and dry.     Findings: No abrasion or rash.  Neurological:     Mental Status: She is alert and oriented to person, place, and time.     GCS: GCS eye subscore is 4. GCS verbal subscore is 5. GCS motor subscore is 6.     Cranial Nerves: No cranial nerve deficit.     Sensory: No sensory deficit.  Psychiatric:        Speech: Speech normal.        Behavior: Behavior normal.      ED Treatments / Results  Labs (all labs ordered are listed, but only abnormal results are displayed) Labs Reviewed  BASIC METABOLIC PANEL - Abnormal; Notable for the following components:      Result Value   Potassium 3.4 (*)    Glucose, Bld 111 (*)    All other components within normal limits  CBC  D-DIMER, QUANTITATIVE (NOT AT [facility])  TROPONIN I (HIGH SENSITIVITY)  TROPONIN I (HIGH SENSITIVITY)    EKG EKG Interpretation  Date/Time:  Monday January 04 2019 16:57:23 EDT Ventricular Rate:  79 PR Interval:    QRS Duration: 104 QT Interval:  379 QTC Calculation: 435 R Axis:   72 Text Interpretation:  Sinus rhythm RAE, consider biatrial enlargement No significant change since last tracing Confirmed by Lorre NickAllen, Cambree Hendrix ([id number]) on 01/04/2019 5:02:14 PM   Radiology Dg Chest 2 View  Result Date: 01/04/2019 CLINICAL DATA:  Chest pain.  Sweating. EXAM: CHEST - 2 VIEW COMPARISON:  03/30/2018. FINDINGS: EKG leads are noted over the chest. Mediastinum hilar structures normal. Lungs are clear. No pleural effusion or pneumothorax. Heart size normal. No acute bony abnormality. IMPRESSION: No acute cardiopulmonary disease.  Stable chest from prior exam. Electronically Signed   By: Maisie Fushomas  Register   On: 01/04/2019 15:25    Procedures Procedures (including critical care time)  Medications  Ordered in ED Medications - No data to display   Initial Impression / Assessment and  Plan / ED Course  I have reviewed the triage vital signs and the nursing notes.  Pertinent labs & imaging results that were available during my care of the patient were reviewed by me and considered in my medical decision making (see chart for details).        Patient's delta troponin here is negative.  D-dimer negative.  EKG and chest x-ray reassuring.  Low suspicion for ACS.  Patient's heart score is 3.  Return precautions given  Final Clinical Impressions(s) / ED Diagnoses   Final diagnoses:  None    ED Discharge Orders    None       Lacretia Leigh, MD 01/04/19 2130

## 2019-01-04 NOTE — ED Triage Notes (Signed)
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 Denies shob, n/v or dizziness. VSS.

## 2019-01-04 NOTE — ED Notes (Signed)
Pt ambulatory to bathroom with steady gait. NAD

## 2020-02-14 ENCOUNTER — Ambulatory Visit
Admission: EM | Admit: 2020-02-14 | Discharge: 2020-02-14 | Disposition: A | Discharge status: Home / Self Care | Payer: Medicare Other | Attending: Physician Assistant | Admitting: Physician Assistant

## 2020-02-14 DIAGNOSIS — Z1152 Encounter for screening for COVID-19: Secondary | ICD-10-CM

## 2020-02-14 NOTE — Discharge Instructions (Signed)

## 2020-02-14 NOTE — ED Triage Notes (Signed)
Pt states had a positive exposure on Saturday. Denies sx's

## 2020-02-17 LAB — NOVEL CORONAVIRUS, NAA: SARS-CoV-2, NAA: NOT DETECTED

## 2020-06-26 ENCOUNTER — Other Ambulatory Visit: Payer: Self-pay | Admitting: Nurse Practitioner

## 2020-06-26 DIAGNOSIS — R922 Inconclusive mammogram: Secondary | ICD-10-CM

## 2020-07-18 ENCOUNTER — Ambulatory Visit
Admission: RE | Admit: 2020-07-18 | Discharge: 2020-07-18 | Disposition: A | Discharge status: Home / Self Care | Payer: Medicare Other | Source: Ambulatory Visit | Attending: Nurse Practitioner | Admitting: Nurse Practitioner

## 2020-07-18 DIAGNOSIS — R9230 Dense breasts, unspecified: Secondary | ICD-10-CM

## 2020-07-18 DIAGNOSIS — R922 Inconclusive mammogram: Secondary | ICD-10-CM

## 2020-07-18 MED ORDER — GADOBUTROL 1 MMOL/ML IV SOLN
7.0000 mL | Freq: Once | INTRAVENOUS | Status: AC | PRN
  Administered 2020-07-18: 7 mL via INTRAVENOUS

## 2020-07-19 ENCOUNTER — Other Ambulatory Visit: Payer: Self-pay | Admitting: Nurse Practitioner

## 2020-07-19 DIAGNOSIS — R9389 Abnormal findings on diagnostic imaging of other specified body structures: Secondary | ICD-10-CM

## 2020-07-26 ENCOUNTER — Ambulatory Visit
Admission: RE | Admit: 2020-07-26 | Discharge: 2020-07-26 | Disposition: A | Discharge status: Home / Self Care | Payer: Medicare Other | Source: Ambulatory Visit | Attending: Nurse Practitioner | Admitting: Nurse Practitioner

## 2020-07-26 ENCOUNTER — Other Ambulatory Visit: Payer: Self-pay

## 2020-07-26 ENCOUNTER — Other Ambulatory Visit: Payer: Self-pay | Admitting: Diagnostic Radiology

## 2020-07-26 DIAGNOSIS — R9389 Abnormal findings on diagnostic imaging of other specified body structures: Secondary | ICD-10-CM

## 2020-07-26 MED ORDER — GADOBUTROL 1 MMOL/ML IV SOLN
6.0000 mL | Freq: Once | INTRAVENOUS | Status: AC | PRN
  Administered 2020-07-26: 6 mL via INTRAVENOUS

## 2020-07-31 ENCOUNTER — Encounter: Payer: Self-pay | Admitting: *Deleted

## 2020-07-31 ENCOUNTER — Encounter: Payer: Self-pay | Admitting: Nurse Practitioner

## 2020-07-31 ENCOUNTER — Telehealth: Payer: Self-pay | Admitting: Hematology

## 2020-07-31 DIAGNOSIS — D0511 Intraductal carcinoma in situ of right breast: Secondary | ICD-10-CM | POA: Insufficient documentation

## 2020-07-31 NOTE — Telephone Encounter (Signed)
SPOKE TO PATIENT TO CONFIRM AFTERNOON [facility] APPOINTMENT FOR 3/2, MAILED PACKET

## 2020-08-02 ENCOUNTER — Ambulatory Visit: Payer: Self-pay | Admitting: General Surgery

## 2020-08-02 ENCOUNTER — Other Ambulatory Visit: Payer: Self-pay

## 2020-08-02 ENCOUNTER — Inpatient Hospital Stay: Payer: Medicare Other

## 2020-08-02 ENCOUNTER — Inpatient Hospital Stay: Payer: Medicare Other | Attending: Hematology | Admitting: Hematology

## 2020-08-02 ENCOUNTER — Encounter: Payer: Self-pay | Admitting: Hematology

## 2020-08-02 ENCOUNTER — Ambulatory Visit (HOSPITAL_BASED_OUTPATIENT_CLINIC_OR_DEPARTMENT_OTHER): Payer: Medicare Other | Admitting: Genetic Counselor

## 2020-08-02 ENCOUNTER — Encounter: Payer: Self-pay | Admitting: *Deleted

## 2020-08-02 ENCOUNTER — Ambulatory Visit: Payer: Medicare Other | Admitting: Physical Therapy

## 2020-08-02 ENCOUNTER — Ambulatory Visit
Admission: RE | Admit: 2020-08-02 | Discharge: 2020-08-02 | Disposition: A | Discharge status: Home / Self Care | Payer: Medicare Other | Source: Ambulatory Visit | Attending: Radiation Oncology | Admitting: Radiation Oncology

## 2020-08-02 VITALS — BP 121/80 | HR 70 | Temp 97.9°F | Resp 18 | Ht 64.0 in | Wt 159.3 lb

## 2020-08-02 DIAGNOSIS — Z8051 Family history of malignant neoplasm of kidney: Secondary | ICD-10-CM | POA: Diagnosis not present

## 2020-08-02 DIAGNOSIS — Z85828 Personal history of other malignant neoplasm of skin: Secondary | ICD-10-CM | POA: Diagnosis not present

## 2020-08-02 DIAGNOSIS — Z780 Asymptomatic menopausal state: Secondary | ICD-10-CM | POA: Insufficient documentation

## 2020-08-02 DIAGNOSIS — Z7289 Other problems related to lifestyle: Secondary | ICD-10-CM | POA: Insufficient documentation

## 2020-08-02 DIAGNOSIS — D0511 Intraductal carcinoma in situ of right breast: Secondary | ICD-10-CM

## 2020-08-02 DIAGNOSIS — F17210 Nicotine dependence, cigarettes, uncomplicated: Secondary | ICD-10-CM | POA: Diagnosis not present

## 2020-08-02 DIAGNOSIS — Z90710 Acquired absence of both cervix and uterus: Secondary | ICD-10-CM | POA: Insufficient documentation

## 2020-08-02 DIAGNOSIS — I10 Essential (primary) hypertension: Secondary | ICD-10-CM | POA: Diagnosis not present

## 2020-08-02 DIAGNOSIS — H938X9 Other specified disorders of ear, unspecified ear: Secondary | ICD-10-CM | POA: Insufficient documentation

## 2020-08-02 DIAGNOSIS — Z801 Family history of malignant neoplasm of trachea, bronchus and lung: Secondary | ICD-10-CM

## 2020-08-02 DIAGNOSIS — Z170 Estrogen receptor positive status [ER+]: Secondary | ICD-10-CM | POA: Insufficient documentation

## 2020-08-02 DIAGNOSIS — Z803 Family history of malignant neoplasm of breast: Secondary | ICD-10-CM

## 2020-08-02 DIAGNOSIS — Z808 Family history of malignant neoplasm of other organs or systems: Secondary | ICD-10-CM

## 2020-08-02 LAB — CMP (CANCER CENTER ONLY)
ALT: 19 U/L (ref 0–44)
AST: 20 U/L (ref 15–41)
Albumin: 4.2 g/dL (ref 3.5–5.0)
Alkaline Phosphatase: 76 U/L (ref 38–126)
Anion gap: 10 (ref 5–15)
BUN: 9 mg/dL (ref 8–23)
CO2: 27 mmol/L (ref 22–32)
Calcium: 9.6 mg/dL (ref 8.9–10.3)
Chloride: 99 mmol/L (ref 98–111)
Creatinine: 0.8 mg/dL (ref 0.44–1.00)
GFR, Estimated: >60 mL/min (ref >60)
Glucose, Bld: 101 mg/dL — ABNORMAL HIGH (ref 70–99)
Potassium: 4.7 mmol/L (ref 3.5–5.1)
Sodium: 136 mmol/L (ref 135–145)
Total Bilirubin: 0.5 mg/dL (ref 0.3–1.2)
Total Protein: 7.4 g/dL (ref 6.5–8.1)

## 2020-08-02 LAB — CBC WITH DIFFERENTIAL (CANCER CENTER ONLY)
Abs Immature Granulocytes: 0.02 10*3/uL (ref 0.00–0.07)
Basophils Absolute: 0.1 10*3/uL (ref 0.0–0.1)
Basophils Relative: 1 %
Eosinophils Absolute: 0.2 10*3/uL (ref 0.0–0.5)
Eosinophils Relative: 3 %
HCT: 47.4 % — ABNORMAL HIGH (ref 36.0–46.0)
Hemoglobin: 15.8 g/dL — ABNORMAL HIGH (ref 12.0–15.0)
Immature Granulocytes: 0 %
Lymphocytes Relative: 34 %
Lymphs Abs: 2.3 10*3/uL (ref 0.7–4.0)
MCH: 31.8 pg (ref 26.0–34.0)
MCHC: 33.3 g/dL (ref 30.0–36.0)
MCV: 95.4 fL (ref 80.0–100.0)
Monocytes Absolute: 0.5 10*3/uL (ref 0.1–1.0)
Monocytes Relative: 8 %
Neutro Abs: 3.6 10*3/uL (ref 1.7–7.7)
Neutrophils Relative %: 54 %
Platelet Count: 275 10*3/uL (ref 150–400)
RBC: 4.97 MIL/uL (ref 3.87–5.11)
RDW: 13.2 % (ref 11.5–15.5)
WBC Count: 6.6 10*3/uL (ref 4.0–10.5)
nRBC: 0 % (ref 0.0–0.2)

## 2020-08-02 LAB — GENETIC SCREENING ORDER

## 2020-08-02 NOTE — Progress Notes (Signed)
[place] Psychosocial Distress Screening Counseling Intern  Counseling intern was referred by distress screening protocol.  The patient scored a 4 on the Psychosocial Distress Thermometer which indicates moderate distress. Counseling intern met with patient in exam room" to assess for distress and other psychosocial needs. The patient attended clinic with her husband, Clair Gulling. The patient reported doing "good now and before today." She reported very little distress related to cancer and more distress related to raising her grandchildren. The patient had a positive attitude and has decent family support between husband and sisters.   ONCBCN DISTRESS SCREENING 08/02/2020  Screening Type Initial Screening  Distress experienced in past week (1-10) 4  Family Problem type Other (comment)  Referral to support programs Yes   Follow up needed: No.  Gaylyn Rong Counseling Intern

## 2020-08-02 NOTE — Progress Notes (Signed)
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   HISTORY OF PRESENT ILLNESS: STEPFANIE Moreno is a 65 y.o. female seen in the multidisciplinary breast clinic for a new diagnosis of right breast cancer. The patient was noted to have a screening MRI due to family history of cancer and dense breast tissue. She had a normal mammogram in November 2021. Her MRI however showed a new area of clumped nonmass enhancement that measured 1.4 cm in the 12:00 position. A biopsy on 07/26/20 showed an intermediate grade DCIS that was ER/PR positive. The lesion did arise in a complex sclerosing lesion. She's seen today to discuss treatment of her cancer.   PREVIOUS RADIATION THERAPY: No   PAST MEDICAL HISTORY:  Past Medical History:  Diagnosis Date  . High cholesterol   . Hypertension        PAST SURGICAL HISTORY: Past Surgical History:  Procedure Laterality Date  . ABDOMINAL HYSTERECTOMY    . CARPAL TUNNEL RELEASE    . MANDIBLE SURGERY    . ROTATOR CUFF REPAIR    . TUBAL LIGATION       FAMILY HISTORY:  Family History  Problem Relation Age of Onset  . Hypertension Mother   . Diabetes Mother   . Hypertension Father   . Aneurysm Father   . Hypertension Sister   . Diabetes Sister   . Breast cancer Sister 4  . Kidney cancer Sister   . Crohn's disease Sister   . Anuerysm Sister      SOCIAL HISTORY:  reports that she has been smoking cigarettes. She has a 50.00 pack-year smoking history. She has never used smokeless tobacco. She reports current alcohol use of about 36.0 standard drinks of alcohol per week. She reports that she does not use drugs. the patinet is married and lives in [place]. 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 She and her husband care for their grand daughter, and great granddaughter.    ALLERGIES: Bactrim [sulfamethoxazole-trimethoprim], Losartan, Erythromycin, Penicillins, and Prednisone   MEDICATIONS:  Current Outpatient Medications  Medication Sig Dispense Refill  . aspirin EC 81 MG tablet Take 325 mg by mouth daily.    Marland Kitchen guaiFENesin-codeine 100-10 MG/5ML syrup Take 10 mLs by mouth every 6 (six) hours as needed.    Marland Kitchen lisinopril-hydrochlorothiazide (ZESTORETIC) 20-12.5 MG tablet Take 1 tablet by mouth daily.    Marland Kitchen loratadine (CLARITIN) 10 MG tablet Take 10 mg by mouth daily.    . sertraline (ZOLOFT) 25 MG tablet Take 25 mg by mouth daily.     No current facility-administered medications for this encounter.     REVIEW OF SYSTEMS: On review of systems, the patient reports that she is doing well overall. She can feel  fullness in her breast since her biopsy was performed.  She denies any chest pain, shortness of breath, cough, fevers, chills, night sweats, unintended weight changes. She denies any bowel or bladder disturbances, and denies abdominal pain, nausea or vomiting. She denies any new musculoskeletal or joint aches or pains. A complete review of systems is obtained and is otherwise negative.     PHYSICAL EXAM:  Wt  Readings from Last 3 Encounters:  08/02/20 159 lb 4.8 oz (72.3 kg)  06/15/18 166 lb (75.3 kg)  03/29/18 163 lb (73.9 kg)   Temp Readings from Last 3 Encounters:  08/02/20 97.9 F (36.6 C) (Tympanic)  02/14/20 98 F (36.7 C) (Oral)  01/04/19 97.8 F (36.6 C) (Oral)   BP Readings from Last 3 Encounters:  08/02/20 121/80  02/14/20 (!) 158/96  01/04/19 118/79   Pulse Readings from Last 3 Encounters:  08/02/20 70  02/14/20 78  01/04/19 76    In general this is a well appearing caucasian female in no acute distress. She's alert and oriented x4 and appropriate throughout the examination.  Cardiopulmonary assessment is negative for acute distress and she exhibits normal effort. Bilateral breast exam is deferred.    ECOG = 0  0 - Asymptomatic (Fully active, able to carry on all predisease activities without restriction)  1 - Symptomatic but completely ambulatory (Restricted in physically strenuous activity but ambulatory and able to carry out work of a light or sedentary nature. For example, light housework, office work)  2 - Symptomatic, <50% in bed during the day (Ambulatory and capable of all self care but unable to carry out any work activities. Up and about more than 50% of waking hours)  3 - Symptomatic, >50% in bed, but not bedbound (Capable of only limited self-care, confined to bed or chair 50% or more of waking hours)  4 - Bedbound (Completely disabled. Cannot carry on any self-care. Totally confined to bed or chair)  5 - Death   Eustace Pen MM, Creech RH, Tormey DC, et al. [phone]). "Toxicity and response criteria of the [facility]". [place] Oncol. 5 (6): 649-55    LABORATORY DATA:  Lab Results  Component Value Date   WBC 6.6 08/02/2020   HGB 15.8 (H) 08/02/2020   HCT 47.4 (H) 08/02/2020   MCV 95.4 08/02/2020   PLT 275 08/02/2020   Lab Results  Component Value Date   NA 136 08/02/2020   K 4.7 08/02/2020   CL 99 08/02/2020   CO2 27 08/02/2020   Lab Results  Component Value Date   ALT 19 08/02/2020   AST 20 08/02/2020   ALKPHOS 76 08/02/2020   BILITOT 0.5 08/02/2020      RADIOGRAPHY: MR BREAST BILATERAL W WO CONTRAST INC CAD  Result Date: 07/18/2020 CLINICAL DATA:  65 year old with a family history of breast cancer in her sister at age 29. Mammographically dense breasts. High risk supplemental screening. LABS:  Not applicable. EXAM: BILATERAL BREAST MRI WITH AND WITHOUT CONTRAST TECHNIQUE: Multiplanar, multisequence MR images of both breasts were obtained prior to and following the intravenous administration of 7 ml of  Gadavist. Three-dimensional MR images were rendered by post-processing of the original MR data on an independent workstation. The three-dimensional MR images were interpreted, and findings are reported in the following complete MRI report for this study. Three dimensional images were evaluated at the independent interpreting workstation using the DynaCAD thin client. COMPARISON:  No prior MRI. Prior mammograms, most recently 04/14/2020 at [facility]. FINDINGS: Breast composition: d. Extreme fibroglandular tissue. Background parenchymal enhancement: Moderate. Right breast: Clumped non-mass enhancement in the UPPER central breast spanning approximately 1.4 x 1.2 x 1.3 cm, demonstrating plateau kinetics. Left breast: No suspicious mass or abnormal enhancement. Lymph nodes: No pathologic lymphadenopathy. Ancillary findings:  None. IMPRESSION: Indeterminate clumped non-mass enhancement involving the UPPER central RIGHT breast spanning 1.4 cm maximally. RECOMMENDATION: MRI guided biopsy of the  non-mass enhancement in the RIGHT breast. BI-RADS CATEGORY  4: Suspicious. Electronically Signed   By: Evangeline Dakin M.D.   On: 07/18/2020 16:11   MM CLIP PLACEMENT RIGHT  Result Date: 07/26/2020 CLINICAL DATA:  Status post MRI guided biopsy of non-mass enhancement within the upper RIGHT breast. EXAM: DIAGNOSTIC RIGHT MAMMOGRAM POST MRI BIOPSY COMPARISON:  Previous exam(s). FINDINGS: Mammographic images were obtained following MRI guided biopsy of indeterminate non-mass enhancement within the upper RIGHT breast. The biopsy marking clip is in expected position at the site of biopsy. IMPRESSION: Appropriate positioning of the barbell shaped biopsy marking clip at the site of biopsy in the upper RIGHT breast. Final Assessment: Post Procedure Mammograms for Marker Placement Electronically Signed   By: Franki Cabot M.D.   On: 07/26/2020 09:22   MR RT BREAST BX W LOC DEV 1ST LESION IMAGE BX SPEC MR GUIDE  Addendum Date:  07/28/2020   ADDENDUM REPORT: 07/28/2020 15:04 ADDENDUM: Pathology revealed INTERMEDIATE GRADE DUCTAL CARCINOMA IN SITU INVOLVING COMPLEX SCLEROSING LESION WITH CALCIFICATIONS AND FOCAL NECROSIS of the Right breast, upper inner. This was found to be concordant by Dr. Franki Cabot. Pathology results were discussed with the patient by telephone. The patient reported doing well after the biopsy with tenderness and minimal bleeding at the site. Post biopsy instructions and care were reviewed and questions were answered. The patient was encouraged to call [facility] for any additional concerns. My direct phone number was provided. The patient was referred to [facility] on August 02, 2020. Pathology results reported by Terie Purser, RN on 07/28/2020. Electronically Signed   By: Franki Cabot M.D.   On: 07/28/2020 15:04   Result Date: 07/28/2020 CLINICAL DATA:  Patient with indeterminate clumped non-mass enhancement within the upper central RIGHT breast presents today for MRI-guided biopsy. EXAM: MRI GUIDED CORE NEEDLE BIOPSY OF THE RIGHT BREAST TECHNIQUE: Multiplanar, multisequence MR imaging of the RIGHT breast was performed both before and after administration of intravenous contrast. CONTRAST:  66m GADAVIST GADOBUTROL 1 MMOL/ML IV SOLN COMPARISON:  Previous exams. FINDINGS: I met with the patient, and we discussed the procedure of MRI guided biopsy, including risks, benefits, and alternatives. Specifically, we discussed the risks of infection, bleeding, tissue injury, clip migration, and inadequate sampling. Informed, written consent was given. The usual time out protocol was performed immediately prior to the procedure. Using sterile technique, 1% Lidocaine, MRI guidance, and a 9 gauge vacuum assisted device, biopsy was performed of the non mass enhancement within the upper slightly inner RIGHT breast using a  lateral approach. At the conclusion of the procedure, a barbell shaped tissue marker clip was deployed into the biopsy cavity. Follow-up 2-view mammogram was performed and dictated separately. IMPRESSION: MRI guided biopsy of the indeterminate non-mass enhancement within the upper inner quadrant of the RIGHT breast. No apparent complications. Electronically Signed: By: SFranki CabotM.D. On: 07/26/2020 09:21       IMPRESSION/PLAN: 1. Intermediate grade, ER/PR positive DCIS of the right breast. Dr. MLisbeth Renshawdiscusses the pathology findings and reviews the nature of right breast disease. The consensus from the breast conference includes breast conservation with lumpectomy. Dr. MLisbeth Renshawreviews the rationale for external radiotherapy to the breast  to reduce risks of local recurrence followed by antiestrogen therapy. We discussed the risks, benefits, short, and long term effects of radiotherapy, as well as the curative intent, and the patient is interested in proceeding. Dr. MLisbeth Renshawdiscusses  the delivery and logistics of radiotherapy and anticipates a course of 4 weeks of radiotherapy. We will see her back a few weeks after surgery to discuss the simulation process and anticipate we starting radiotherapy about 4-6 weeks after surgery.   In a visit lasting 60 minutes, greater than 50% of the time was spent face to face reviewing her case, as well as in preparation of, discussing, and coordinating the patient's care.  The above documentation reflects my direct findings during this shared patient visit. Please see the separate note by Dr. Lisbeth Renshaw on this date for the remainder of the patient's plan of care.    Carola Rhine, [facility]    **Disclaimer: This note was dictated with voice recognition software. Similar sounding words can inadvertently be transcribed and this note may contain transcription errors which may not have been corrected upon publication of note.**

## 2020-08-02 NOTE — Progress Notes (Signed)
[place]   [phone] Fax:([phone]) [place] Note   Patient Care Team: Simona Huh, NP as PCP - General (Nurse Practitioner) Rockwell Germany, RN as Oncology Nurse Navigator Mauro Kaufmann, RN as Oncology Nurse Navigator Jovita Kussmaul, MD as Consulting Physician (General Surgery) [name], MD as Consulting Physician (Hematology) Kyung Rudd, MD as Consulting Physician (Radiation Oncology)  Date of Service:  08/02/2020   CHIEF COMPLAINTS/PURPOSE OF CONSULTATION:  Newly Diagnosed Ductal carcinoma in situ (DCIS) of right breast   Oncology History Overview Note  Cancer Staging Ductal carcinoma in situ (DCIS) of right breast Staging form: Breast, AJCC 8th Edition - Clinical stage from 07/26/2020: Stage 0 (cTis (DCIS), cN0, cM0, G1, ER+, PR+, HER2: Not Assessed) - Signed by [name], MD on 08/01/2020 Stage prefix: Initial diagnosis    Ductal carcinoma in situ (DCIS) of right breast  07/18/2020 Breast MRI   IMPRESSION: Indeterminate clumped non-mass enhancement involving the UPPER central RIGHT breast spanning 1.4 x 1.2 x 1.3  cm maximally.   07/26/2020 Cancer Staging   Staging form: Breast, AJCC 8th Edition - Clinical stage from 07/26/2020: Stage 0 (cTis (DCIS), cN0, cM0, G1, ER+, PR+, HER2: Not Assessed) - Signed by [name], MD on 08/01/2020 Stage prefix: Initial diagnosis   07/26/2020 Initial Biopsy   Diagnosis Breast, right, needle core biopsy, upper inner - DUCTAL CARCINOMA IN SITU INVOLVING COMPLEX SCLEROSING LESION WITH CALCIFICATIONS. Microscopic Comment The DCIS is of intermediate nuclear grade with focal necrosis. E-cadherin is positive and CK5/6 is negative in DCIS. P63, Calponin and SMM-1 demonstrate the presence of myoepithelium throughout. Ancillary studies will be reported separately. Results reported to [facility] on 07/27/2020. Dr. Tresa Moore reviewed the case.   07/26/2020 Receptors her2     Results: IMMUNOHISTOCHEMICAL AND MORPHOMETRIC ANALYSIS PERFORMED MANUALLY Estrogen Receptor: 95%, POSITIVE, STRONG STAINING INTENSITY Progesterone Receptor: 15%, POSITIVE, STRONG STAINING INTENSITY   07/31/2020 Initial Diagnosis   Ductal carcinoma in situ (DCIS) of right breast      HISTORY OF PRESENTING ILLNESS:  Brittany Moreno 65 y.o. female is a here because of newly diagnosed right breast DCIS. The patient presents to the clinic today accompanied by her husband.   Her right breast mass was found on her first screening MRI given family history of breast cancer. Her mammogram 6 months ago was normal. She notes having known right breast cyst which was palpable and has not changed. She denies prior breast pain, but notes pain from biopsy.  Today the patient notes they no breathing issues, SOB, chest pain, stomach issues or joint pain.   Socially she is married. She has 2 adult sons. She is retired Garment/textile technologist. She watchers her granddaughter 5 days a week. She will drink 24 beers in a week for the past 2 years. She smokes 1ppd for over 50 years. She had lung cancer screening in 01/2020. She has tried to quit through multiple methods without success. She has a PMHx of HTN. I reviewed her medication list with her. She notes she is on Zoloft for her ear sounds of buzzing. She had jaw surgery, cholecystectomy, rotator cuff surgery. She notes her sister had breast cancer in her 86s.    GYN HISTORY  Menarchal: xx - 2 LMP: 1990 - menopause at 77 by hysterectomy due to fibroids Contraceptive: Yes for 20 years  HRT: No  G2P2: first at age 65, no breast feeding     REVIEW OF SYSTEMS:  Constitutional: Denies fevers, chills or abnormal night sweats Eyes: Denies blurriness of vision, double vision or watery eyes Ears, nose, mouth, throat, and face: Denies mucositis or sore throat Respiratory: Denies cough, dyspnea or wheezes Cardiovascular: Denies palpitation, chest discomfort or lower extremity  swelling Gastrointestinal:  Denies nausea, heartburn or change in bowel habits Skin: Denies abnormal skin rashes Lymphatics: Denies new lymphadenopathy or easy bruising Neurological:Denies numbness, tingling or new weaknesses Behavioral/Psych: Mood is stable, no new changes  All other systems were reviewed with the patient and are negative.   MEDICAL HISTORY:  Past Medical History:  Diagnosis Date  . High cholesterol   . Hypertension     SURGICAL HISTORY: Past Surgical History:  Procedure Laterality Date  . ABDOMINAL HYSTERECTOMY    . CARPAL TUNNEL RELEASE    . MANDIBLE SURGERY    . ROTATOR CUFF REPAIR    . TUBAL LIGATION      SOCIAL HISTORY: Social History   Socioeconomic History  . Marital status: Married    Spouse name: Not on file  . Number of children: 2  . Years of education: 58  . Highest education level: High school graduate  Occupational History  . Occupation: retired Corporate treasurer  Tobacco Use  . Smoking status: Current Every Day Smoker    Packs/day: 1.00    Years: 50.00    Pack years: 50.00    Types: Cigarettes  . Smokeless tobacco: Never Used  Vaping Use  . Vaping Use: Never used  Substance and Sexual Activity  . Alcohol use: Yes    Alcohol/week: 36.0 standard drinks    Types: 12 Standard drinks or equivalent, 24 Cans of beer per week    Comment: 3-4 cans beer daily since 2020  . Drug use: No  . Sexual activity: Yes    Birth control/protection: Post-menopausal, Surgical    Comment: partial hysterectomy, tubal ligation  Other Topics Concern  . Not on file  Social History Narrative   Lives with husband and 2 granddaughters in a 2 story home.  Retired Corporate treasurer.  Has 2 children.  Education: high school.     Social Determinants of Health   Financial Resource Strain: Not on file  Food Insecurity: Not on file  Transportation Needs: Not on file  Physical Activity: Not on file  Stress: Not on file  Social Connections: Not on file   Intimate Partner Violence: Not on file    FAMILY HISTORY: Family History  Problem Relation Age of Onset  . Hypertension Mother   . Diabetes Mother   . Hypertension Father   . Aneurysm Father   . Hypertension Sister   . Diabetes Sister   . Breast cancer Sister 69  . Kidney cancer Sister   . Crohn's disease Sister   . Anuerysm Sister     ALLERGIES:  is allergic to bactrim [sulfamethoxazole-trimethoprim], losartan, erythromycin, penicillins, and prednisone.  MEDICATIONS:  Current Outpatient Medications  Medication Sig Dispense Refill  . guaiFENesin-codeine 100-10 MG/5ML syrup Take 10 mLs by mouth every 6 (six) hours as needed.    Marland Kitchen lisinopril-hydrochlorothiazide (ZESTORETIC) 20-12.5 MG tablet Take 1 tablet by mouth daily.    Marland Kitchen loratadine (CLARITIN) 10 MG tablet Take 10 mg by mouth daily.    . sertraline (ZOLOFT) 25 MG tablet Take 25 mg by mouth daily.    Marland Kitchen aspirin EC 81 MG tablet Take 325 mg by mouth daily.     No current facility-administered medications for this visit.    PHYSICAL EXAMINATION: ECOG  PERFORMANCE STATUS: 0 - Asymptomatic  Vitals:   08/02/20 1245  BP: 121/80  Pulse: 70  Resp: 18  Temp: 97.9 F (36.6 C)  SpO2: 97%   Filed Weights   08/02/20 1245  Weight: 159 lb 4.8 oz (72.3 kg)    GENERAL:alert, no distress and comfortable SKIN: skin color, texture, turgor are normal, no rashes or significant lesions EYES: normal, Conjunctiva are pink and non-injected, sclera clear  NECK: supple, thyroid normal size, non-tender, without nodularity LYMPH:  no palpable lymphadenopathy in the cervical, axillary  LUNGS: clear to auscultation and percussion with normal breathing effort HEART: regular rate & rhythm and no murmurs and no lower extremity edema ABDOMEN:abdomen soft, non-tender and normal bowel sounds Musculoskeletal:no cyanosis of digits and no clubbing  NEURO: alert & oriented x 3 with fluent speech, no focal motor/sensory deficits BREAST: (+) 3x4cm  lump in UOQ of right breast with extensive ecchymosis. Left Breast exam benign.  LABORATORY DATA:  I have reviewed the data as listed CBC Latest Ref Rng & Units 08/02/2020 01/04/2019 06/14/2018  WBC 4.0 - 10.5 K/uL 6.6 7.2 13.8(H)  Hemoglobin 12.0 - 15.0 g/dL 15.8(H) 14.4 14.0  Hematocrit 36.0 - 46.0 % 47.4(H) 43.5 43.2  Platelets 150 - 400 K/uL 275 289 204    CMP Latest Ref Rng & Units 08/02/2020 01/04/2019 06/14/2018  Glucose 70 - 99 mg/dL 101(H) 111(H) 142(H)  BUN 8 - 23 mg/dL '9 10 17  ' Creatinine 0.44 - 1.00 mg/dL 0.80 0.80 0.72  Sodium 135 - 145 mmol/L 136 136 140  Potassium 3.5 - 5.1 mmol/L 4.7 3.4(L) 3.6  Chloride 98 - 111 mmol/L 99 101 108  CO2 22 - 32 mmol/L 27 25 20(L)  Calcium 8.9 - 10.3 mg/dL 9.6 9.5 9.2  Total Protein 6.5 - 8.1 g/dL 7.4 - 6.9  Total Bilirubin 0.3 - 1.2 mg/dL 0.5 - 0.5  Alkaline Phos 38 - 126 U/L 76 - 62  AST 15 - 41 U/L 20 - 26  ALT 0 - 44 U/L 19 - 31     RADIOGRAPHIC STUDIES: I have personally reviewed the radiological images as listed and agreed with the findings in the report. MR BREAST BILATERAL W WO CONTRAST INC CAD  Result Date: 07/18/2020 CLINICAL DATA:  65 year old with a family history of breast cancer in her sister at age 22. Mammographically dense breasts. High risk supplemental screening. LABS:  Not applicable. EXAM: BILATERAL BREAST MRI WITH AND WITHOUT CONTRAST TECHNIQUE: Multiplanar, multisequence MR images of both breasts were obtained prior to and following the intravenous administration of 7 ml of Gadavist. Three-dimensional MR images were rendered by post-processing of the original MR data on an independent workstation. The three-dimensional MR images were interpreted, and findings are reported in the following complete MRI report for this study. Three dimensional images were evaluated at the independent interpreting workstation using the DynaCAD thin client. COMPARISON:  No prior MRI. Prior mammograms, most recently 04/14/2020 at [facility]  mammography. FINDINGS: Breast composition: d. Extreme fibroglandular tissue. Background parenchymal enhancement: Moderate. Right breast: Clumped non-mass enhancement in the UPPER central breast spanning approximately 1.4 x 1.2 x 1.3 cm, demonstrating plateau kinetics. Left breast: No suspicious mass or abnormal enhancement. Lymph nodes: No pathologic lymphadenopathy. Ancillary findings:  None. IMPRESSION: Indeterminate clumped non-mass enhancement involving the UPPER central RIGHT breast spanning 1.4 cm maximally. RECOMMENDATION: MRI guided biopsy of the non-mass enhancement in the RIGHT breast. BI-RADS CATEGORY  4: Suspicious. Electronically Signed   By: Evangeline Dakin M.D.   On: 07/18/2020  16:11   MM CLIP PLACEMENT RIGHT  Result Date: 07/26/2020 CLINICAL DATA:  Status post MRI guided biopsy of non-mass enhancement within the upper RIGHT breast. EXAM: DIAGNOSTIC RIGHT MAMMOGRAM POST MRI BIOPSY COMPARISON:  Previous exam(s). FINDINGS: Mammographic images were obtained following MRI guided biopsy of indeterminate non-mass enhancement within the upper RIGHT breast. The biopsy marking clip is in expected position at the site of biopsy. IMPRESSION: Appropriate positioning of the barbell shaped biopsy marking clip at the site of biopsy in the upper RIGHT breast. Final Assessment: Post Procedure Mammograms for Marker Placement Electronically Signed   By: Franki Cabot M.D.   On: 07/26/2020 09:22   MR RT BREAST BX W LOC DEV 1ST LESION IMAGE BX SPEC MR GUIDE  Addendum Date: 07/28/2020   ADDENDUM REPORT: 07/28/2020 15:04 ADDENDUM: Pathology revealed INTERMEDIATE GRADE DUCTAL CARCINOMA IN SITU INVOLVING COMPLEX SCLEROSING LESION WITH CALCIFICATIONS AND FOCAL NECROSIS of the Right breast, upper inner. This was found to be concordant by Dr. Franki Cabot. Pathology results were discussed with the patient by telephone. The patient reported doing well after the biopsy with tenderness and minimal bleeding at the site.  Post biopsy instructions and care were reviewed and questions were answered. The patient was encouraged to call [facility] for any additional concerns. My direct phone number was provided. The patient was referred to [facility] on August 02, 2020. Pathology results reported by Terie Purser, RN on 07/28/2020. Electronically Signed   By: Franki Cabot M.D.   On: 07/28/2020 15:04   Result Date: 07/28/2020 CLINICAL DATA:  Patient with indeterminate clumped non-mass enhancement within the upper central RIGHT breast presents today for MRI-guided biopsy. EXAM: MRI GUIDED CORE NEEDLE BIOPSY OF THE RIGHT BREAST TECHNIQUE: Multiplanar, multisequence MR imaging of the RIGHT breast was performed both before and after administration of intravenous contrast. CONTRAST:  23m GADAVIST GADOBUTROL 1 MMOL/ML IV SOLN COMPARISON:  Previous exams. FINDINGS: I met with the patient, and we discussed the procedure of MRI guided biopsy, including risks, benefits, and alternatives. Specifically, we discussed the risks of infection, bleeding, tissue injury, clip migration, and inadequate sampling. Informed, written consent was given. The usual time out protocol was performed immediately prior to the procedure. Using sterile technique, 1% Lidocaine, MRI guidance, and a 9 gauge vacuum assisted device, biopsy was performed of the non mass enhancement within the upper slightly inner RIGHT breast using a lateral approach. At the conclusion of the procedure, a barbell shaped tissue marker clip was deployed into the biopsy cavity. Follow-up 2-view mammogram was performed and dictated separately. IMPRESSION: MRI guided biopsy of the indeterminate non-mass enhancement within the upper inner quadrant of the RIGHT breast. No apparent complications. Electronically Signed: By: SFranki CabotM.D. On: 07/26/2020 09:21    ASSESSMENT & PLAN:  SKORALYN PRESTAGEis a 65y.o. caucasian female with a history of high cholesterol and HTN, inner ear buzzing (on Zoloft)  1. Right breast DCIS, intermediate grade, ER+/PR+ -We discussed her image findings and the biopsy results in great details. Although her 04/2020 Mammogram was normal, her 07/18/20 MRI for additional screening showed 1.4cm mass in the right breast. Her 07/26/20 biopsy showed DCIS of right breast, intermediate grade, ER/PR positive.  -She is a candidate for breast conservation surgery. She has been seen by breast surgeon Dr. TMarlou Starks who recommends lumpectomy.  -Her DCIS will be cured by complete surgical resection. Any form of adjuvant therapy  is preventive. -I reviewed her risk and treatment benefits using the Breast Cancer Nomogram from [facility]). Based on family, PMx and lifestyle she has a 18% risk of developing future breast cancer in the next 10 years. Her risk would drop to 7-9% with RT or Antiestrogen therapy alone. With both adjuvant treatments her risk would decrease to 3%.  -Given her strongly positive ER and PR, I do recommend antiestrogen therapy with Tamoxifen or AI, which decrease her risk of future breast cancer by ~50%. I reviewed possible side effects in great detail. I will likely recommend Tamoxifen. ---The potential side effects, which includes but not limited to, hot flash, skin and vaginal dryness, slightly increased risk of cardiovascular disease and cataract, small risk of thrombosis and endometrial cancer, were discussed with her in great details. Preventive strategies for thrombosis, such as being physically active, using compression stocks, avoid cigarette smoking, etc., were reviewed with her.  She has had hysterectomy, no risk of endometrial cancer.  She voiced good understanding, and agrees to proceed. Will start after she completes adjuvant breast radiation. -Will obtain her DEXA from [place] to establish baseline.  -She will likely benefit  from breast radiation if she undergo lumpectomy to decrease the risk of breast cancer. She will discuss this further with Dr Lisbeth Renshaw today.  -We also discussed that biopsy may have sampling limitation, we will review her surgical path, to see if she has any invasive carcinoma components. -We discussed breast cancer surveillance after she completes treatment, Including annual mammogram, breast exam every 6-12 months. -Labs reviewed, CBC and CMP WNL except Hg 15.8, BG 101. Her breast lump appears to be 3x4cm in right breast after her breast biopsy  -She will proceed with surgery soon. I will f/u with her after surgery or radiation    2. Alcohol and Smoking cessation  -For the past 2 years she has been drinking beer 3-4 cans a day. I discussed reducing use or stopping completely as this can lead to liver complications with long term use.  -She smokes 1ppd for the past 50 years. She has been counseled on smoking cessation before and tried to quit using multiple methods without success.  -She has started lung cancer screening in 01/2020. Will continue to scan yearly.    3. Inner Ear Buzzing  -For the past few years she has inner ear buzzing sounds which ENT notes this is side effects from prior medications.  -She was placed on Zoloft to help her buzzing by her PCP. I discussed if she proceeds with Tamoxifen, I recommend switching Zoloft to Effexor given drug interaction.    PLAN:  -Obtain DEXA reports from [place] [facility] with surgery soon  -F/u after surgery or radiation    No orders of the defined types were placed in this encounter.   All questions were answered. The patient knows to call the clinic with any problems, questions or concerns. The total time spent in the appointment was 50 minutes.     [name], MD 08/02/2020 5:07 PM  I, Joslyn Devon, am acting as scribe for [name], MD.   I have reviewed the above documentation for accuracy and completeness, and I agree with the above.

## 2020-08-03 ENCOUNTER — Telehealth: Payer: Self-pay | Admitting: Hematology

## 2020-08-03 ENCOUNTER — Encounter: Payer: Self-pay | Admitting: Genetic Counselor

## 2020-08-03 DIAGNOSIS — Z801 Family history of malignant neoplasm of trachea, bronchus and lung: Secondary | ICD-10-CM | POA: Insufficient documentation

## 2020-08-03 DIAGNOSIS — Z803 Family history of malignant neoplasm of breast: Secondary | ICD-10-CM | POA: Insufficient documentation

## 2020-08-03 DIAGNOSIS — Z8051 Family history of malignant neoplasm of kidney: Secondary | ICD-10-CM | POA: Insufficient documentation

## 2020-08-03 DIAGNOSIS — Z808 Family history of malignant neoplasm of other organs or systems: Secondary | ICD-10-CM | POA: Insufficient documentation

## 2020-08-03 DIAGNOSIS — Z85828 Personal history of other malignant neoplasm of skin: Secondary | ICD-10-CM | POA: Insufficient documentation

## 2020-08-03 NOTE — Telephone Encounter (Signed)
Checked out appointment. No LOS notes needing to be scheduled. No changes made. 

## 2020-08-03 NOTE — Progress Notes (Signed)
REFERRING PROVIDER: [name], MD [place],  [place] [address]  PRIMARY PROVIDER:  Simona Huh, NP  PRIMARY REASON FOR VISIT:  1. Ductal carcinoma in situ (DCIS) of right breast   2. History of basal cell carcinoma   3. Family history of breast cancer   4. Family history of kidney cancer   5. Family history of basal cell carcinoma   6. Family history of lung cancer   7. Family history of thyroid cancer      HISTORY OF PRESENT ILLNESS:   Brittany Moreno, a 65 y.o. female, was seen for a [place] cancer genetics consultation at the request of Dr. Burr Medico due to a personal and family history of cancer.  [name] presents to clinic today to discuss the possibility of a hereditary predisposition to cancer, genetic testing, and to further clarify her future cancer risks, as well as potential cancer risks for family members.   In February of 2022, at the age of 13, [name] was diagnosed with ductal carcinoma of the right breast. The tumor is ER+/PR+. The treatment plan includes surgery, radiation therapy, and antiestrogen therapy.   [name] also has a history of basal cell carcinoma removed from her face at age 60 and 49.  CANCER HISTORY:  Oncology History Overview Note  Cancer Staging Ductal carcinoma in situ (DCIS) of right breast Staging form: Breast, AJCC 8th Edition - Clinical stage from 07/26/2020: Stage 0 (cTis (DCIS), cN0, cM0, G1, ER+, PR+, HER2: Not Assessed) - Signed by [name], MD on 08/01/2020 Stage prefix: Initial diagnosis    Ductal carcinoma in situ (DCIS) of right breast  07/18/2020 Breast MRI   IMPRESSION: Indeterminate clumped non-mass enhancement involving the UPPER central RIGHT breast spanning 1.4 x 1.2 x 1.3  cm maximally.   07/26/2020 Cancer Staging   Staging form: Breast, AJCC 8th Edition - Clinical stage from 07/26/2020: Stage 0 (cTis (DCIS), cN0, cM0, G1, ER+, PR+, HER2: Not Assessed) - Signed by [name], MD on 08/01/2020 Stage prefix:  Initial diagnosis   07/26/2020 Initial Biopsy   Diagnosis Breast, right, needle core biopsy, upper inner - DUCTAL CARCINOMA IN SITU INVOLVING COMPLEX SCLEROSING LESION WITH CALCIFICATIONS. Microscopic Comment The DCIS is of intermediate nuclear grade with focal necrosis. E-cadherin is positive and CK5/6 is negative in DCIS. P63, Calponin and SMM-1 demonstrate the presence of myoepithelium throughout. Ancillary studies will be reported separately. Results reported to [facility] on 07/27/2020. Dr. Tresa Moore reviewed the case.   07/26/2020 Receptors her2    Results: IMMUNOHISTOCHEMICAL AND MORPHOMETRIC ANALYSIS PERFORMED MANUALLY Estrogen Receptor: 95%, POSITIVE, STRONG STAINING INTENSITY Progesterone Receptor: 15%, POSITIVE, STRONG STAINING INTENSITY   07/31/2020 Initial Diagnosis   Ductal carcinoma in situ (DCIS) of right breast     RISK FACTORS:  Menarche was at age 16.  First live birth at age 48.  OCP use for approximately 20 years.  Ovaries intact: yes.  Hysterectomy: yes.  Menopausal status: postmenopausal.  HRT use: 0 years. Mammogram within the last year: yes.   Past Medical History:  Diagnosis Date  . Family history of basal cell carcinoma   . Family history of breast cancer   . Family history of kidney cancer   . Family history of lung cancer   . Family history of thyroid cancer   . High cholesterol   . History of basal cell carcinoma   . Hypertension     Past Surgical History:  Procedure Laterality Date  .  ABDOMINAL HYSTERECTOMY    . CARPAL TUNNEL RELEASE    . MANDIBLE SURGERY    . ROTATOR CUFF REPAIR    . TUBAL LIGATION      Social History   Socioeconomic History  . Marital status: Married    Spouse name: Not on file  . Number of children: 2  . Years of education: 67  . Highest education level: High school graduate  Occupational History  . Occupation: retired Corporate treasurer  Tobacco Use  . Smoking status: Current Every Day  Smoker    Packs/day: 1.00    Years: 50.00    Pack years: 50.00    Types: Cigarettes  . Smokeless tobacco: Never Used  Vaping Use  . Vaping Use: Never used  Substance and Sexual Activity  . Alcohol use: Yes    Alcohol/week: 36.0 standard drinks    Types: 24 Cans of beer, 12 Standard drinks or equivalent per week    Comment: 3-4 cans beer daily since 2020  . Drug use: No  . Sexual activity: Yes    Birth control/protection: Post-menopausal, Surgical    Comment: partial hysterectomy, tubal ligation  Other Topics Concern  . Not on file  Social History Narrative   Lives with husband and 2 granddaughters in a 2 story home.  Retired Corporate treasurer.  Has 2 children.  Education: high school.     Social Determinants of Health   Financial Resource Strain: Not on file  Food Insecurity: Not on file  Transportation Needs: Not on file  Physical Activity: Not on file  Stress: Not on file  Social Connections: Not on file     FAMILY HISTORY:  We obtained a detailed, 4-generation family history.  Significant diagnoses are listed below: Family History  Problem Relation Age of Onset  . Hypertension Mother   . Diabetes Mother   . Hypertension Father   . Aneurysm Father   . Hypertension Sister   . Diabetes Sister   . Breast cancer Sister 33  . Kidney cancer Sister        dx <61, unilateral  . Crohn's disease Sister   . Anuerysm Sister   . Breast cancer Maternal Aunt        unknown age  . Thyroid cancer Maternal Aunt        unknown age  . Lung cancer Maternal Uncle        dx >50, worked in Architect, smoker  . Basal cell carcinoma Son 25       multiple (back, face, shoulder, leg), +sun exposure   [name] has two sons (ages 7 and 65). Her younger son has had multiple basal cell carcinomas removed from his back, face, shoulder, and leg. The first was diagnosed at age 50. She had three sisters (two alive at ages 34 and 48, one deceased at age 57). One sister was diagnosed with  breast cancer at age 69 and also had kidney cancer diagnosed younger than 73. A niece was recently diagnosed with metastatic breast cancer at age 48.  Ms. Denunzio mother died at age 39 without cancer. There was one maternal aunt and four maternal uncles. Her aunt has a history of breast cancer and thyroid cancer (unknown ages of diagnosis). One uncle died from lung cancer older than 56. There is no known cancer among maternal cousins. Ms. Bartle maternal grandmother died at age 77 without cancer. She does not have information about her maternal grandfather.  Ms. Phang father died at age 2 without  cancer. There were five paternal aunts and four paternal uncles. There is no known cancer among paternal aunts/uncles or paternal cousins. Ms. Gutzwiller paternal grandmother died in her 26s or 72s and was "sick", although Ms. Mooneyham does not know if she had cancer. Her paternal grandfather died in his 41s or 41s without cancer.  Ms. Suares is unaware of previous family history of genetic testing for hereditary cancer risks. Patient's maternal ancestors are of Native American descent, and paternal ancestors are of [place] and [place] descent. There is no reported Ashkenazi Jewish ancestry. There is no known consanguinity.  GENETIC COUNSELING ASSESSMENT: Ms. Alamo is a 65 y.o. female with a personal history of breast cancer and a family history of breast cancer, kidney cancer, and thyroid cancer, which is somewhat suggestive of a hereditary cancer syndrome and predisposition to cancer. We, therefore, discussed and recommended the following at today's visit.   DISCUSSION: We discussed that approximately 5-10% of breast cancer is hereditary, with most cases associated with the BRCA1 and BRCA2 genes. There are other genes that can be associated with hereditary breast cancer syndromes. These include ATM, CHEK2, PALB2, etc. We discussed that testing is beneficial for several reasons, including knowing about other cancer  risks, identifying potential screening and risk-reduction options that may be appropriate, and to understand if other family members could be at risk for cancer and allow them to undergo genetic testing.  We reviewed the characteristics, features and inheritance patterns of hereditary cancer syndromes. We also discussed genetic testing, including the appropriate family members to test, the process of testing, insurance coverage and turn-around-time for results. We discussed the implications of a negative, positive and/or variant of uncertain significant result. In order to get genetic test results in a timely manner so that Ms. Pittinger can use these genetic test results for surgical decisions, we recommended Ms. Kauffman pursue genetic testing for the [facility]. Once complete, we recommend Ms. Benscoter pursue reflex genetic testing to the CancerNext-Expanded + RNAinsight gene panel.   The BRCAplus panel offered by [employer] and includes sequencing and deletion/duplication analysis for the following 8 genes: ATM, BRCA1, BRCA2, CDH1, CHEK2, PALB2, PTEN, and TP53. The CancerNext-Expanded + RNAinsight gene panel offered by [employer] and includes sequencing and rearrangement analysis for the following 77 genes: AIP, ALK, APC, ATM, AXIN2, BAP1, BARD1, BLM, BMPR1A, BRCA1, BRCA2, BRIP1, CDC73, CDH1, CDK4, CDKN1B, CDKN2A, CHEK2, CTNNA1, DICER1, FANCC, FH, FLCN, GALNT12, KIF1B, LZTR1, MAX, MEN1, MET, MLH1, MSH2, MSH3, MSH6, MUTYH, NBN, NF1, NF2, NTHL1, PALB2, PHOX2B, PMS2, POT1, PRKAR1A, PTCH1, PTEN, RAD51C, RAD51D, RB1, RECQL, RET, SDHA, SDHAF2, SDHB, SDHC, SDHD, SMAD4, SMARCA4, SMARCB1, SMARCE1, STK11, SUFU, TMEM127, TP53, TSC1, TSC2, VHL and XRCC2 (sequencing and deletion/duplication); EGFR, EGLN1, HOXB13, KIT, MITF, PDGFRA, POLD1 and POLE (sequencing only); EPCAM and GREM1 (deletion/duplication only). RNA data is routinely analyzed for use in variant interpretation for all genes.  Based on Ms. Shuffield's  personal and family history of cancer, she meets medical criteria for genetic testing. Despite that she meets criteria, she may still have an out of pocket cost.   PLAN: After considering the risks, benefits, and limitations, Ms. Doell provided informed consent to pursue genetic testing and the blood sample was sent to [facility] for analysis of the BRCAplus panel and CancerNext-Expanded panel. Results should be available within approximately one-two weeks' time, at which point they will be disclosed by telephone to Ms. Darling, as will any additional recommendations warranted by these results. Ms. Hege will receive a summary  of her genetic counseling visit and a copy of her results once available. This information will also be available in Epic.   Ms. Bouchillon questions were answered to her satisfaction today. Our contact information was provided should additional questions or concerns arise. Thank you for the referral and allowing [place] to share in the care of your patient.   Clint Guy, [place], [facility] Licensed, Certified Dispensing [email or web address] Phone: [phone]  The patient was seen for a total of 25 minutes in face-to-face genetic counseling.  This patient was discussed with Drs. Magrinat, Lindi Adie and/or Burr Medico who agrees with the above.    _______________________________________________________________________ For Office Staff:  Number of people involved in session: 1 Was an Intern/ student involved with case: no

## 2020-08-04 ENCOUNTER — Encounter: Payer: Self-pay | Admitting: *Deleted

## 2020-08-07 ENCOUNTER — Other Ambulatory Visit: Payer: Self-pay | Admitting: *Deleted

## 2020-08-07 DIAGNOSIS — D0511 Intraductal carcinoma in situ of right breast: Secondary | ICD-10-CM

## 2020-08-08 ENCOUNTER — Telehealth: Payer: Self-pay | Admitting: *Deleted

## 2020-08-08 ENCOUNTER — Encounter: Payer: Self-pay | Admitting: *Deleted

## 2020-08-08 NOTE — Telephone Encounter (Signed)
Spoke with patient to follow up from [facility] 3/2 and assess navigation needs.

## 2020-08-15 ENCOUNTER — Encounter: Payer: Self-pay | Admitting: Dietician

## 2020-08-15 NOTE — Progress Notes (Signed)
Nutrition  Patient identified after attending Breast Clinic on 08/02/20. Patient given nutrition packet with RD contact information by nurse navigator at that time.  Chart reviewed.   Patient with newly diagnosed DCIS of right breast. She is scheduled for breast conservation surgery with Dr. Carolynne Edouard on 09/01/20. Patient has agreed to recommended adjuvant radiotherapy followed by adjuvant antiestrogen therapy. Anticipated radiotherapy to start approximately 4-6 weeks after surgery.  No reported nutrition concerns noted on review of symptoms completed with patient on 3/2.   Wt Readings from Last 3 Encounters:  08/02/20 159 lb 4.8 oz (72.3 kg)  06/15/18 166 lb (75.3 kg)  03/29/18 163 lb (73.9 kg)    Patient is not currently at nutrition risk. Please consult RD if nutrition issues arise.

## 2020-08-18 ENCOUNTER — Telehealth: Payer: Self-pay | Admitting: Genetic Counselor

## 2020-08-18 NOTE — Telephone Encounter (Signed)
Discussed with Brittany Moreno that her genetic test results have been delayed, and that we now expect to have her results by the end of next week. We will give her a call once we have these results.

## 2020-08-21 DIAGNOSIS — Z1379 Encounter for other screening for genetic and chromosomal anomalies: Secondary | ICD-10-CM | POA: Insufficient documentation

## 2020-08-22 ENCOUNTER — Telehealth: Payer: Self-pay | Admitting: Genetic Counselor

## 2020-08-22 ENCOUNTER — Ambulatory Visit: Payer: Self-pay | Admitting: Genetic Counselor

## 2020-08-22 ENCOUNTER — Encounter: Payer: Self-pay | Admitting: Genetic Counselor

## 2020-08-22 DIAGNOSIS — Z1379 Encounter for other screening for genetic and chromosomal anomalies: Secondary | ICD-10-CM

## 2020-08-22 NOTE — Telephone Encounter (Signed)
Revealed negative genetic testing. Discussed that we do not know why she has breast cancer or why there is cancer in the family. There could be a genetic mutation in the family that Brittany Moreno did not inherit. There could also be a mutation in a different gene that we are not testing, or our current technology may not be able to detect certain mutations. It will therefore be important for her to stay in contact with genetics to keep up with whether additional testing may be appropriate in the future.

## 2020-08-22 NOTE — Progress Notes (Signed)
HPI:  Brittany Moreno was previously seen in the [facility] clinic due to a personal and family history of cancer and concerns regarding a hereditary predisposition to cancer. Please refer to our prior cancer genetics clinic note for more information regarding our discussion, assessment and recommendations, at the time. Ms. Brittany Moreno recent genetic test results were disclosed to her, as were recommendations warranted by these results. These results and recommendations are discussed in more detail below.  CANCER HISTORY:  Oncology History Overview Note  Cancer Staging Ductal carcinoma in situ (DCIS) of right breast Staging form: Breast, AJCC 8th Edition - Clinical stage from 07/26/2020: Stage 0 (cTis (DCIS), cN0, cM0, G1, ER+, PR+, HER2: Not Assessed) - Signed by [name], MD on 08/01/2020 Stage prefix: Initial diagnosis    Ductal carcinoma in situ (DCIS) of right breast  07/18/2020 Breast MRI   IMPRESSION: Indeterminate clumped non-mass enhancement involving the UPPER central RIGHT breast spanning 1.4 x 1.2 x 1.3  cm maximally.   07/26/2020 Cancer Staging   Staging form: Breast, AJCC 8th Edition - Clinical stage from 07/26/2020: Stage 0 (cTis (DCIS), cN0, cM0, G1, ER+, PR+, HER2: Not Assessed) - Signed by [name], MD on 08/01/2020 Stage prefix: Initial diagnosis   07/26/2020 Initial Biopsy   Diagnosis Breast, right, needle core biopsy, upper inner - DUCTAL CARCINOMA IN SITU INVOLVING COMPLEX SCLEROSING LESION WITH CALCIFICATIONS. Microscopic Comment The DCIS is of intermediate nuclear grade with focal necrosis. E-cadherin is positive and CK5/6 is negative in DCIS. P63, Calponin and SMM-1 demonstrate the presence of myoepithelium throughout. Ancillary studies will be reported separately. Results reported to [facility] on 07/27/2020. Dr. Tresa Moore reviewed the case.   07/26/2020 Receptors her2    Results: IMMUNOHISTOCHEMICAL AND MORPHOMETRIC ANALYSIS PERFORMED  MANUALLY Estrogen Receptor: 95%, POSITIVE, STRONG STAINING INTENSITY Progesterone Receptor: 15%, POSITIVE, STRONG STAINING INTENSITY   07/31/2020 Initial Diagnosis   Ductal carcinoma in situ (DCIS) of right breast   08/21/2020 Genetic Testing   Negative genetic testing:  No pathogenic variants detected on the [facility] CancerNext-Expanded + RNAinsight panel. The report date is 08/21/2020.   The CancerNext-Expanded + RNAinsight gene panel offered by [employer] and includes sequencing and rearrangement analysis for the following 77 genes: AIP, ALK, APC, ATM, AXIN2, BAP1, BARD1, BLM, BMPR1A, BRCA1, BRCA2, BRIP1, CDC73, CDH1, CDK4, CDKN1B, CDKN2A, CHEK2, CTNNA1, DICER1, FANCC, FH, FLCN, GALNT12, KIF1B, LZTR1, MAX, MEN1, MET, MLH1, MSH2, MSH3, MSH6, MUTYH, NBN, NF1, NF2, NTHL1, PALB2, PHOX2B, PMS2, POT1, PRKAR1A, PTCH1, PTEN, RAD51C, RAD51D, RB1, RECQL, RET, SDHA, SDHAF2, SDHB, SDHC, SDHD, SMAD4, SMARCA4, SMARCB1, SMARCE1, STK11, SUFU, TMEM127, TP53, TSC1, TSC2, VHL and XRCC2 (sequencing and deletion/duplication); EGFR, EGLN1, HOXB13, KIT, MITF, PDGFRA, POLD1 and POLE (sequencing only); EPCAM and GREM1 (deletion/duplication only). RNA data is routinely analyzed for use in variant interpretation for all genes.     FAMILY HISTORY:  We obtained a detailed, 4-generation family history.  Significant diagnoses are listed below: Family History  Problem Relation Age of Onset  . Hypertension Mother   . Diabetes Mother   . Hypertension Father   . Aneurysm Father   . Hypertension Sister   . Diabetes Sister   . Breast cancer Sister 53  . Kidney cancer Sister        dx <61, unilateral  . Crohn's disease Sister   . Anuerysm Sister   . Breast cancer Maternal Aunt        unknown age  . Thyroid cancer Maternal Aunt  unknown age  . Lung cancer Maternal Uncle        dx >50, worked in Architect, smoker  . Basal cell carcinoma Son 25       multiple (back, face, shoulder, leg), +sun exposure   Ms.  Delgreco has two sons (ages 57 and 32). Her younger son has had multiple basal cell carcinomas removed from his back, face, shoulder, and leg. The first was diagnosed at age 52. She had three sisters (two alive at ages 42 and 75, one deceased at age 78). One sister was diagnosed with breast cancer at age 73 and also had kidney cancer diagnosed younger than 61. A niece was recently diagnosed with metastatic breast cancer at age 83.  [name] mother died at age 58 without cancer. There was one maternal aunt and four maternal uncles. Her aunt has a history of breast cancer and thyroid cancer (unknown ages of diagnosis). One uncle died from lung cancer older than 17. There is no known cancer among maternal cousins. [name] maternal grandmother died at age 67 without cancer. She does not have information about her maternal grandfather.  Ms. Single father died at age 56 without cancer. There were five paternal aunts and four paternal uncles. There is no known cancer among paternal aunts/uncles or paternal cousins. Ms. Petron paternal grandmother died in her 41s or 29s and was "sick", although [name] does not know if she had cancer. Her paternal grandfather died in his 17s or 33s without cancer.  [name] is unaware of previous family history of genetic testing for hereditary cancer risks. Patient's maternal ancestors are of Native American descent, and paternal ancestors are of [place] and [place] descent. There is no reported Ashkenazi Jewish ancestry. There is no known consanguinity.  GENETIC TEST RESULTS: Genetic testing reported out on 08/21/2020 through the [facility] + RNAinsight panel. No pathogenic variants were detected.   The CancerNext-Expanded + RNAinsight gene panel offered by [employer] and includes sequencing and rearrangement analysis for the following 77 genes: AIP, ALK, APC, ATM, AXIN2, BAP1, BARD1, BLM, BMPR1A, BRCA1, BRCA2, BRIP1, CDC73, CDH1, CDK4, CDKN1B, CDKN2A,  CHEK2, CTNNA1, DICER1, FANCC, FH, FLCN, GALNT12, KIF1B, LZTR1, MAX, MEN1, MET, MLH1, MSH2, MSH3, MSH6, MUTYH, NBN, NF1, NF2, NTHL1, PALB2, PHOX2B, PMS2, POT1, PRKAR1A, PTCH1, PTEN, RAD51C, RAD51D, RB1, RECQL, RET, SDHA, SDHAF2, SDHB, SDHC, SDHD, SMAD4, SMARCA4, SMARCB1, SMARCE1, STK11, SUFU, TMEM127, TP53, TSC1, TSC2, VHL and XRCC2 (sequencing and deletion/duplication); EGFR, EGLN1, HOXB13, KIT, MITF, PDGFRA, POLD1 and POLE (sequencing only); EPCAM and GREM1 (deletion/duplication only). RNA data is routinely analyzed for use in variant interpretation for all genes. The test report will be scanned into EPIC and located under the Molecular Pathology section of the Results Review tab.  A portion of the result report is included below for reference.     We discussed with Ms. Gracey that because current genetic testing is not perfect, it is possible there may be a gene mutation in one of these genes that current testing cannot detect, but that chance is small.  We also discussed that there could be another gene that has not yet been discovered, or that we have not yet tested, that is responsible for the cancer diagnoses in the family. It is also possible there is a hereditary cause for the cancer in the family that Ms. Konigsberg did not inherit and therefore was not identified in her testing.  Therefore, it is important to remain in touch with cancer genetics in the future so  that we can continue to offer Ms. Bogacz the most up to date genetic testing.   CANCER SCREENING RECOMMENDATIONS: Ms. Fillingim test result is considered negative (normal).  This means that we have not identified a hereditary cause for her personal and family history of cancer at this time. While reassuring, this does not definitively rule out a hereditary predisposition to cancer. It is still possible that there could be genetic mutations that are undetectable by current technology. There could be genetic mutations in genes that have not been tested  or identified to increase cancer risk.  Therefore, it is recommended she continue to follow the cancer management and screening guidelines provided by heroncology and primary healthcare provider.   An individual's cancer risk and medical management are not determined by genetic test results alone. Overall cancer risk assessment incorporates additional factors, including personal medical history, family history, and any available genetic information that may result in a personalized plan for cancer prevention and surveillance.  RECOMMENDATIONS FOR FAMILY MEMBERS:  Individuals in this family might be at some increased risk of developing cancer, over the general population risk, simply due to the family history of cancer.  We recommended women in this family have a yearly mammogram beginning at age 15, or 37 years younger than the earliest onset of cancer, an annual clinical breast exam, and perform monthly breast self-exams. Women in this family should also have a gynecological exam as recommended by their primary provider. All family members should be referred for colonoscopy starting at age 54.  It is also possible there is a hereditary cause for the cancer in Ms. Deschamps's family that she did not inherit and therefore was not identified in her.  Based on Ms. Tatar's family history, we recommended her sister, who was diagnosed with breast cancer at age 73, and/or her niece, who was diagnosed with breast cancer at age 50, have genetic counseling and testing. Ms. Scioli will let us know if we can be of any assistance in coordinating genetic counseling and/or testing for this family member.   FOLLOW-UP: Lastly, we discussed with Ms. Macmurray that cancer genetics is a rapidly advancing field and it is possible that new genetic tests will be appropriate for her and/or her family members in the future. We encouraged her to remain in contact with cancer genetics on an annual basis so we can update her personal and family  histories and let her know of advances in cancer genetics that may benefit this family.   Our contact number was provided. Ms. Millay questions were answered to her satisfaction, and she knows she is welcome to call us at anytime with additional questions or concerns.   Clint Guy, MS, [facility] Genetic Counselor [email or web address] Phone: [phone]

## 2020-08-24 ENCOUNTER — Encounter (HOSPITAL_BASED_OUTPATIENT_CLINIC_OR_DEPARTMENT_OTHER): Payer: Self-pay | Admitting: General Surgery

## 2020-08-24 ENCOUNTER — Other Ambulatory Visit: Payer: Self-pay

## 2020-08-28 ENCOUNTER — Encounter (HOSPITAL_BASED_OUTPATIENT_CLINIC_OR_DEPARTMENT_OTHER)
Admission: RE | Admit: 2020-08-28 | Discharge: 2020-08-28 | Disposition: A | Discharge status: Home / Self Care | Payer: Medicare Other | Source: Ambulatory Visit | Attending: General Surgery | Admitting: General Surgery

## 2020-08-28 DIAGNOSIS — Z029 Encounter for administrative examinations, unspecified: Secondary | ICD-10-CM | POA: Insufficient documentation

## 2020-08-28 LAB — BASIC METABOLIC PANEL
Anion gap: 9 (ref 5–15)
BUN: 5 mg/dL — ABNORMAL LOW (ref 8–23)
CO2: 26 mmol/L (ref 22–32)
Calcium: 9.4 mg/dL (ref 8.9–10.3)
Chloride: 98 mmol/L (ref 98–111)
Creatinine, Ser: 0.65 mg/dL (ref 0.44–1.00)
GFR, Estimated: >60 mL/min (ref >60)
Glucose, Bld: 100 mg/dL — ABNORMAL HIGH (ref 70–99)
Potassium: 4.8 mmol/L (ref 3.5–5.1)
Sodium: 133 mmol/L — ABNORMAL LOW (ref 135–145)

## 2020-08-28 NOTE — Progress Notes (Signed)

## 2020-08-29 ENCOUNTER — Other Ambulatory Visit (HOSPITAL_COMMUNITY)
Admission: RE | Admit: 2020-08-29 | Discharge: 2020-08-29 | Disposition: A | Discharge status: Home / Self Care | Payer: Medicare Other | Source: Ambulatory Visit | Attending: General Surgery | Admitting: General Surgery

## 2020-08-29 DIAGNOSIS — Z01812 Encounter for preprocedural laboratory examination: Secondary | ICD-10-CM | POA: Diagnosis present

## 2020-08-29 DIAGNOSIS — U071 COVID-19: Secondary | ICD-10-CM | POA: Insufficient documentation

## 2020-08-29 LAB — SARS CORONAVIRUS 2 (TAT 6-24 HRS): SARS Coronavirus 2: POSITIVE — AB

## 2020-08-29 NOTE — Progress Notes (Signed)
Toniann Fail Triage RN at Dr Billey Chang office called and notified that pt's covid test done today is positive.  She stated that will let Dr. Carolynne Edouard know.

## 2020-08-30 ENCOUNTER — Telehealth: Payer: Self-pay | Admitting: Adult Health

## 2020-08-30 ENCOUNTER — Telehealth (HOSPITAL_COMMUNITY): Payer: Self-pay

## 2020-08-30 NOTE — Telephone Encounter (Signed)
Called to discuss with patient about COVID-19 symptoms and the use of one of the available treatments for those with mild to moderate Covid symptoms and at a high risk of hospitalization.  Pt appears to qualify for outpatient treatment due to co-morbid conditions and/or a member of an at-risk group in accordance with the FDA Emergency Use Authorization.    Asymptomatic, I gave Brittany Moreno my number to call if she develops any symptoms in the next couple of days.  Without symptoms treatment is not indicated.  Brittany Moreno

## 2020-08-30 NOTE — Telephone Encounter (Signed)
Courtesy call to surgeon to advise of COVID19 lab results - Positive. Dr. Carolynne Edouard advised he was not aware of anything and for me to contact the office. Called the office([phone]) - left a detailed message on the voicemail of patient's + COVID19 lab results. MBM

## 2020-09-11 ENCOUNTER — Encounter: Payer: Self-pay | Admitting: *Deleted

## 2020-09-21 ENCOUNTER — Encounter (HOSPITAL_BASED_OUTPATIENT_CLINIC_OR_DEPARTMENT_OTHER): Payer: Self-pay | Admitting: General Surgery

## 2020-09-26 ENCOUNTER — Encounter (HOSPITAL_BASED_OUTPATIENT_CLINIC_OR_DEPARTMENT_OTHER)
Admission: RE | Admit: 2020-09-26 | Discharge: 2020-09-26 | Disposition: A | Discharge status: Home / Self Care | Payer: Medicare Other | Source: Ambulatory Visit | Attending: General Surgery | Admitting: General Surgery

## 2020-09-26 DIAGNOSIS — Z01812 Encounter for preprocedural laboratory examination: Secondary | ICD-10-CM | POA: Insufficient documentation

## 2020-09-26 LAB — BASIC METABOLIC PANEL
Anion gap: 10 (ref 5–15)
BUN: 7 mg/dL — ABNORMAL LOW (ref 8–23)
CO2: 24 mmol/L (ref 22–32)
Calcium: 8.8 mg/dL — ABNORMAL LOW (ref 8.9–10.3)
Chloride: 97 mmol/L — ABNORMAL LOW (ref 98–111)
Creatinine, Ser: 0.68 mg/dL (ref 0.44–1.00)
GFR, Estimated: >60 mL/min (ref >60)
Glucose, Bld: 114 mg/dL — ABNORMAL HIGH (ref 70–99)
Potassium: 4.1 mmol/L (ref 3.5–5.1)
Sodium: 131 mmol/L — ABNORMAL LOW (ref 135–145)

## 2020-09-27 ENCOUNTER — Other Ambulatory Visit (HOSPITAL_COMMUNITY): Payer: Medicare Other

## 2020-09-29 ENCOUNTER — Ambulatory Visit (HOSPITAL_BASED_OUTPATIENT_CLINIC_OR_DEPARTMENT_OTHER): Payer: Medicare Other | Admitting: Anesthesiology

## 2020-09-29 ENCOUNTER — Ambulatory Visit (HOSPITAL_BASED_OUTPATIENT_CLINIC_OR_DEPARTMENT_OTHER)
Admission: RE | Admit: 2020-09-29 | Discharge: 2020-09-29 | Disposition: A | Discharge status: Home / Self Care | Payer: Medicare Other | Attending: General Surgery | Admitting: General Surgery

## 2020-09-29 ENCOUNTER — Encounter (HOSPITAL_BASED_OUTPATIENT_CLINIC_OR_DEPARTMENT_OTHER)
Admission: RE | Disposition: A | Discharge status: Home / Self Care | Payer: Self-pay | Source: Home / Self Care | Attending: General Surgery

## 2020-09-29 ENCOUNTER — Encounter (HOSPITAL_BASED_OUTPATIENT_CLINIC_OR_DEPARTMENT_OTHER): Payer: Self-pay | Admitting: General Surgery

## 2020-09-29 ENCOUNTER — Other Ambulatory Visit: Payer: Self-pay

## 2020-09-29 DIAGNOSIS — F17200 Nicotine dependence, unspecified, uncomplicated: Secondary | ICD-10-CM | POA: Insufficient documentation

## 2020-09-29 DIAGNOSIS — Z803 Family history of malignant neoplasm of breast: Secondary | ICD-10-CM | POA: Diagnosis not present

## 2020-09-29 DIAGNOSIS — D0511 Intraductal carcinoma in situ of right breast: Secondary | ICD-10-CM | POA: Insufficient documentation

## 2020-09-29 DIAGNOSIS — Z170 Estrogen receptor positive status [ER+]: Secondary | ICD-10-CM | POA: Insufficient documentation

## 2020-09-29 HISTORY — PX: BREAST LUMPECTOMY WITH RADIOACTIVE SEED LOCALIZATION: SHX6424

## 2020-09-29 SURGERY — BREAST LUMPECTOMY WITH RADIOACTIVE SEED LOCALIZATION
Anesthesia: General | Site: Breast | Laterality: Right

## 2020-09-29 MED ORDER — PROPOFOL 10 MG/ML IV BOLUS
INTRAVENOUS | Status: DC | PRN
  Administered 2020-09-29: 200 mg via INTRAVENOUS

## 2020-09-29 MED ORDER — ONDANSETRON HCL 4 MG/2ML IJ SOLN
INTRAMUSCULAR | Status: DC | PRN
  Administered 2020-09-29: 4 mg via INTRAVENOUS

## 2020-09-29 MED ORDER — GABAPENTIN 300 MG PO CAPS
300.0000 mg | ORAL_CAPSULE | ORAL | Status: AC
  Administered 2020-09-29: 300 mg via ORAL

## 2020-09-29 MED ORDER — VANCOMYCIN HCL IN DEXTROSE 1-5 GM/200ML-% IV SOLN
1000.0000 mg | INTRAVENOUS | Status: AC
  Administered 2020-09-29: 1000 mg via INTRAVENOUS

## 2020-09-29 MED ORDER — LIDOCAINE 2% (20 MG/ML) 5 ML SYRINGE
INTRAMUSCULAR | Status: AC
  Filled 2020-09-29: qty 5

## 2020-09-29 MED ORDER — CHLORHEXIDINE GLUCONATE CLOTH 2 % EX PADS: 6.0000 | MEDICATED_PAD | Freq: Once | CUTANEOUS | Status: DC

## 2020-09-29 MED ORDER — TRAMADOL HCL 50 MG PO TABS: 50.0000 mg | ORAL_TABLET | Freq: Four times a day (QID) | ORAL | 0 refills | Status: DC | PRN

## 2020-09-29 MED ORDER — PROPOFOL 10 MG/ML IV BOLUS
INTRAVENOUS | Status: AC
  Filled 2020-09-29: qty 20

## 2020-09-29 MED ORDER — FENTANYL CITRATE (PF) 100 MCG/2ML IJ SOLN
INTRAMUSCULAR | Status: AC
  Filled 2020-09-29: qty 2

## 2020-09-29 MED ORDER — FENTANYL CITRATE (PF) 100 MCG/2ML IJ SOLN: 25.0000 ug | INTRAMUSCULAR | Status: DC | PRN

## 2020-09-29 MED ORDER — MIDAZOLAM HCL 2 MG/2ML IJ SOLN
INTRAMUSCULAR | Status: AC
  Filled 2020-09-29: qty 2

## 2020-09-29 MED ORDER — LIDOCAINE HCL (CARDIAC) PF 100 MG/5ML IV SOSY
PREFILLED_SYRINGE | INTRAVENOUS | Status: DC | PRN
  Administered 2020-09-29: 60 mg via INTRAVENOUS

## 2020-09-29 MED ORDER — KETOROLAC TROMETHAMINE 15 MG/ML IJ SOLN: 15.0000 mg | Freq: Once | INTRAMUSCULAR | Status: DC | PRN

## 2020-09-29 MED ORDER — EPHEDRINE SULFATE 50 MG/ML IJ SOLN
INTRAMUSCULAR | Status: DC | PRN
  Administered 2020-09-29 (×3): 5 mg via INTRAVENOUS

## 2020-09-29 MED ORDER — FENTANYL CITRATE (PF) 100 MCG/2ML IJ SOLN
INTRAMUSCULAR | Status: DC | PRN
  Administered 2020-09-29: 25 ug via INTRAVENOUS

## 2020-09-29 MED ORDER — ACETAMINOPHEN 500 MG PO TABS
1000.0000 mg | ORAL_TABLET | ORAL | Status: AC
  Administered 2020-09-29: 1000 mg via ORAL

## 2020-09-29 MED ORDER — BUPIVACAINE-EPINEPHRINE (PF) 0.25% -1:200000 IJ SOLN
INTRAMUSCULAR | Status: DC | PRN
  Administered 2020-09-29: 20 mL

## 2020-09-29 MED ORDER — MIDAZOLAM HCL 5 MG/5ML IJ SOLN
INTRAMUSCULAR | Status: DC | PRN
  Administered 2020-09-29: 2 mg via INTRAVENOUS

## 2020-09-29 MED ORDER — ONDANSETRON HCL 4 MG/2ML IJ SOLN
INTRAMUSCULAR | Status: AC
  Filled 2020-09-29: qty 2

## 2020-09-29 MED ORDER — DEXAMETHASONE SODIUM PHOSPHATE 10 MG/ML IJ SOLN
INTRAMUSCULAR | Status: DC | PRN
  Administered 2020-09-29: 5 mg via INTRAVENOUS

## 2020-09-29 MED ORDER — LACTATED RINGERS IV SOLN
INTRAVENOUS | Status: DC
  Administered 2020-09-29: 10 mL/h via INTRAVENOUS

## 2020-09-29 MED ORDER — GABAPENTIN 300 MG PO CAPS
ORAL_CAPSULE | ORAL | Status: AC
  Filled 2020-09-29: qty 1

## 2020-09-29 MED ORDER — VANCOMYCIN HCL IN DEXTROSE 1-5 GM/200ML-% IV SOLN
INTRAVENOUS | Status: AC
  Filled 2020-09-29: qty 200

## 2020-09-29 MED ORDER — ONDANSETRON HCL 4 MG/2ML IJ SOLN: 4.0000 mg | Freq: Once | INTRAMUSCULAR | Status: DC | PRN

## 2020-09-29 MED ORDER — ACETAMINOPHEN 500 MG PO TABS
ORAL_TABLET | ORAL | Status: AC
  Filled 2020-09-29: qty 2

## 2020-09-29 MED ORDER — AMISULPRIDE (ANTIEMETIC) 5 MG/2ML IV SOLN: 10.0000 mg | Freq: Once | INTRAVENOUS | Status: DC | PRN

## 2020-09-29 MED ORDER — EPHEDRINE 5 MG/ML INJ
INTRAVENOUS | Status: AC
  Filled 2020-09-29: qty 10

## 2020-09-29 SURGICAL SUPPLY — 41 items
ADH SKN CLS APL DERMABOND .7 (GAUZE/BANDAGES/DRESSINGS) ×1
APL PRP STRL LF DISP 70% ISPRP (MISCELLANEOUS) ×1
APPLIER CLIP 9.375 MED OPEN (MISCELLANEOUS) ×2
APR CLP MED 9.3 20 MLT OPN (MISCELLANEOUS) ×1
BLADE SURG 15 STRL LF DISP TIS (BLADE) ×1 IMPLANT
BLADE SURG 15 STRL SS (BLADE) ×2
CANISTER SUC SOCK COL 7IN (MISCELLANEOUS) ×2 IMPLANT
CANISTER SUCT 1200ML W/VALVE (MISCELLANEOUS) ×2 IMPLANT
CHLORAPREP W/TINT 26 (MISCELLANEOUS) ×2 IMPLANT
CLIP APPLIE 9.375 MED OPEN (MISCELLANEOUS) IMPLANT
COVER BACK TABLE 60X90IN (DRAPES) ×2 IMPLANT
COVER MAYO STAND STRL (DRAPES) ×2 IMPLANT
COVER PROBE W GEL 5X96 (DRAPES) ×2 IMPLANT
DERMABOND ADVANCED (GAUZE/BANDAGES/DRESSINGS) ×1
DERMABOND ADVANCED .7 DNX12 (GAUZE/BANDAGES/DRESSINGS) ×1 IMPLANT
DRAPE LAPAROSCOPIC ABDOMINAL (DRAPES) ×2 IMPLANT
DRAPE UTILITY XL STRL (DRAPES) ×2 IMPLANT
ELECT COATED BLADE 2.86 ST (ELECTRODE) ×2 IMPLANT
ELECT REM PT RETURN 9FT ADLT (ELECTROSURGICAL) ×2
ELECTRODE REM PT RTRN 9FT ADLT (ELECTROSURGICAL) ×1 IMPLANT
GLOVE SURG ENC MOIS LTX SZ6.5 (GLOVE) ×2 IMPLANT
GLOVE SURG ENC MOIS LTX SZ7.5 (GLOVE) ×4 IMPLANT
GLOVE SURG UNDER POLY LF SZ7 (GLOVE) ×2 IMPLANT
GOWN STRL REUS W/ TWL LRG LVL3 (GOWN DISPOSABLE) ×2 IMPLANT
GOWN STRL REUS W/TWL LRG LVL3 (GOWN DISPOSABLE) ×4
KIT MARKER MARGIN INK (KITS) ×2 IMPLANT
NDL HYPO 25X1 1.5 SAFETY (NEEDLE) IMPLANT
NEEDLE HYPO 25X1 1.5 SAFETY (NEEDLE) ×2 IMPLANT
NS IRRIG 1000ML POUR BTL (IV SOLUTION) ×1 IMPLANT
PACK BASIN DAY SURGERY FS (CUSTOM PROCEDURE TRAY) ×2 IMPLANT
PENCIL SMOKE EVACUATOR (MISCELLANEOUS) ×2 IMPLANT
SLEEVE SCD COMPRESS KNEE MED (STOCKING) ×2 IMPLANT
SPONGE LAP 18X18 RF (DISPOSABLE) ×2 IMPLANT
SUT MON AB 4-0 PC3 18 (SUTURE) ×2 IMPLANT
SUT SILK 2 0 SH (SUTURE) IMPLANT
SUT VICRYL 3-0 CR8 SH (SUTURE) ×2 IMPLANT
SYR CONTROL 10ML LL (SYRINGE) ×1 IMPLANT
TOWEL GREEN STERILE FF (TOWEL DISPOSABLE) ×2 IMPLANT
TRAY FAXITRON CT DISP (TRAY / TRAY PROCEDURE) ×2 IMPLANT
TUBE CONNECTING 20X1/4 (TUBING) ×2 IMPLANT
YANKAUER SUCT BULB TIP NO VENT (SUCTIONS) ×1 IMPLANT

## 2020-09-29 NOTE — Interval H&P Note (Signed)
History and Physical Interval Note:  09/29/2020 8:19 AM  Brittany Moreno  has presented today for surgery, with the diagnosis of RIGHT BREAST DCIS.  The various methods of treatment have been discussed with the patient and family. After consideration of risks, benefits and other options for treatment, the patient has consented to  Procedure(s): RIGHT BREAST LUMPECTOMY WITH RADIOACTIVE SEED LOCALIZATION (Right) as a surgical intervention.  The patient's history has been reviewed, patient examined, no change in status, stable for surgery.  I have reviewed the patient's chart and labs.  Questions were answered to the patient's satisfaction.     Chevis Pretty III

## 2020-09-29 NOTE — Anesthesia Postprocedure Evaluation (Signed)
Anesthesia Post Note  Patient: Brittany Moreno  Procedure(s) Performed: RIGHT BREAST LUMPECTOMY WITH RADIOACTIVE SEED LOCALIZATION (Right Breast)     Patient location during evaluation: PACU Anesthesia Type: General Level of consciousness: awake Pain management: pain level controlled Vital Signs Assessment: post-procedure vital signs reviewed and stable Respiratory status: spontaneous breathing, nonlabored ventilation, respiratory function stable and patient connected to nasal cannula oxygen Cardiovascular status: blood pressure returned to baseline and stable Postop Assessment: no apparent nausea or vomiting Anesthetic complications: no   No complications documented.  Last Vitals:  Vitals:   09/29/20 1030 09/29/20 1053  BP: 139/86 (!) 152/87  Pulse: 91 85  Resp: 20 18  Temp:  (!) 36.3 C  SpO2: 100% 97%    Last Pain:  Vitals:   09/29/20 1053  TempSrc:   PainSc: 0-No pain                 Mame Twombly P Richrd Kuzniar

## 2020-09-29 NOTE — Discharge Instructions (Signed)
No Tylenol until 2:00pm if needed   Post Anesthesia Home Care Instructions  Activity: Get plenty of rest for the remainder of the day. A responsible individual must stay with you for 24 hours following the procedure.  For the next 24 hours, DO NOT: -Drive a car -Advertising copywriter -Drink alcoholic beverages -Take any medication unless instructed by your physician -Make any legal decisions or sign important papers.  Meals: Start with liquid foods such as gelatin or soup. Progress to regular foods as tolerated. Avoid greasy, spicy, heavy foods. If nausea and/or vomiting occur, drink only clear liquids until the nausea and/or vomiting subsides. Call your physician if vomiting continues.  Special Instructions/Symptoms: Your throat may feel dry or sore from the anesthesia or the breathing tube placed in your throat during surgery. If this causes discomfort, gargle with warm salt water. The discomfort should disappear within 24 hours.  If you had a scopolamine patch placed behind your ear for the management of post- operative nausea and/or vomiting:  1. The medication in the patch is effective for 72 hours, after which it should be removed.  Wrap patch in a tissue and discard in the trash. Wash hands thoroughly with soap and water. 2. You may remove the patch earlier than 72 hours if you experience unpleasant side effects which may include dry mouth, dizziness or visual disturbances. 3. Avoid touching the patch. Wash your hands with soap and water after contact with the patch.

## 2020-09-29 NOTE — Anesthesia Preprocedure Evaluation (Addendum)
Anesthesia Evaluation  Patient identified by MRN, date of birth, ID band Patient awake    Reviewed: Allergy & Precautions, NPO status , Patient's Chart, lab work & pertinent test results  Airway Mallampati: II  TM Distance: >3 FB Neck ROM: Full    Dental  (+) Missing   Pulmonary Current SmokerPatient did not abstain from smoking.,    Pulmonary exam normal breath sounds clear to auscultation       Cardiovascular hypertension, Pt. on medications Normal cardiovascular exam Rhythm:Regular Rate:Normal  ECG: NSR   Neuro/Psych negative neurological ROS  negative psych ROS   GI/Hepatic negative GI ROS, Neg liver ROS,   Endo/Other  negative endocrine ROS  Renal/GU negative Renal ROS     Musculoskeletal negative musculoskeletal ROS (+)   Abdominal   Peds  Hematology negative hematology ROS (+)   Anesthesia Other Findings RIGHT BREAST DCIS  Reproductive/Obstetrics                            Anesthesia Physical Anesthesia Plan  ASA: II  Anesthesia Plan: General   Post-op Pain Management:    Induction: Intravenous  PONV Risk Score and Plan: 2 and Ondansetron, Dexamethasone, Midazolam and Treatment may vary due to age or medical condition  Airway Management Planned: LMA  Additional Equipment:   Intra-op Plan:   Post-operative Plan: Extubation in OR  Informed Consent: I have reviewed the patients History and Physical, chart, labs and discussed the procedure including the risks, benefits and alternatives for the proposed anesthesia with the patient or authorized representative who has indicated his/her understanding and acceptance.     Dental advisory given  Plan Discussed with: CRNA  Anesthesia Plan Comments:        Anesthesia Quick Evaluation

## 2020-09-29 NOTE — H&P (Signed)
Brittany Moreno  Location: [facility] Patient #: [id number] DOB: [birth year] Undefined / Language: [place] / Race: White Female   History of Present Illness The patient is a 65 year old female who presents with breast cancer.We are asked to see the patient in consultation by Dr. Mosetta Putt to evaluate her for a new right breast cancer. The patient is a 65 year old white female who presents after a high risk MRI showed a 1.4cm area of enhancement in the upper right breast. her mammogram in November was neg. The axilla looked neg. This was biopsied and came back as a Int grade DCIS that was ER and PR +. She has a sister with breast cancer and she does smoke a ppd.   Past Surgical History Breast Biopsy  Right. Gallbladder Surgery - Laparoscopic  Hysterectomy (not due to cancer) - Partial  Shoulder Surgery  Right.  Diagnostic Studies History  Colonoscopy  within last year Mammogram  within last year Pap Smear  >5 years ago  Medication History  Medications Reconciled  Social History  Alcohol use  Heavy alcohol use. Caffeine use  Carbonated beverages, Coffee, Tea. No drug use  Tobacco use  Current every day smoker.  Family History Breast Cancer  Sister. Cerebrovascular Accident  Mother. Diabetes Mellitus  Mother, Sister. Heart Disease  Sister. Hypertension  Father, Mother, Sister. Kidney Disease  Sister. Thyroid problems  Sister.  Pregnancy / Birth History  Age at menarche  12 years. Age of menopause  78-55 Gravida  2 Maternal age  29-20 Para  2  Other Problems Alcohol Abuse  Cancer  Diverticulosis  Gastroesophageal Reflux Disease  High blood pressure  Hypercholesterolemia     Review of Systems  General Not Present- Appetite Loss, Chills, Fatigue, Fever, Night Sweats, Weight Gain and Weight Loss. Skin Not Present- Change in Wart/Mole, Dryness, Hives, Jaundice, New Lesions, Non-Healing Wounds, Rash and Ulcer. HEENT Present-  Seasonal Allergies and Wears glasses/contact lenses. Not Present- Earache, Hearing Loss, Hoarseness, Nose Bleed, Oral Ulcers, Ringing in the Ears, Sinus Pain, Sore Throat, Visual Disturbances and Yellow Eyes. Respiratory Present- Snoring. Not Present- Bloody sputum, Chronic Cough, Difficulty Breathing and Wheezing. Breast Not Present- Breast Mass, Breast Pain, Nipple Discharge and Skin Changes. Cardiovascular Not Present- Chest Pain, Difficulty Breathing Lying Down, Leg Cramps, Palpitations, Rapid Heart Rate, Shortness of Breath and Swelling of Extremities. Gastrointestinal Not Present- Abdominal Pain, Bloating, Bloody Stool, Change in Bowel Habits, Chronic diarrhea, Constipation, Difficulty Swallowing, Excessive gas, Gets full quickly at meals, Hemorrhoids, Indigestion, Nausea, Rectal Pain and Vomiting. Female Genitourinary Present- Frequency. Not Present- Nocturia, Painful Urination, Pelvic Pain and Urgency. Musculoskeletal Not Present- Back Pain, Joint Pain, Joint Stiffness, Muscle Pain, Muscle Weakness and Swelling of Extremities. Neurological Present- Tremor. Not Present- Decreased Memory, Fainting, Headaches, Numbness, Seizures, Tingling, Trouble walking and Weakness. Psychiatric Not Present- Anxiety, Bipolar, Change in Sleep Pattern, Depression, Fearful and Frequent crying. Endocrine Not Present- Cold Intolerance, Excessive Hunger, Hair Changes, Heat Intolerance, Hot flashes and New Diabetes. Hematology Present- Easy Bruising. Not Present- Blood Thinners, Excessive bleeding, Gland problems, HIV and Persistent Infections.   Physical Exam  General Mental Status-Alert. General Appearance-Consistent with stated age. Hydration-Well hydrated. Voice-Normal.  Head and Neck Head-normocephalic, atraumatic with no lesions or palpable masses. Trachea-midline. Thyroid Gland Characteristics - normal size and consistency.  Eye Eyeball - Bilateral-Extraocular movements  intact. Sclera/Conjunctiva - Bilateral-No scleral icterus.  Chest and Lung Exam Chest and lung exam reveals -quiet, even and easy respiratory effort with no use of accessory muscles and  on auscultation, normal breath sounds, no adventitious sounds and normal vocal resonance. Inspection Chest Wall - Normal. Back - normal. Note: she does have wheezing bilaterally   Breast Note: there is a moderate palpable bruise centrally in the right breast. there is no other palpable mass in either breast. there is no palpable axillary, supraclavicular, or cervical lymphadenopathy   Cardiovascular Cardiovascular examination reveals -normal heart sounds, regular rate and rhythm with no murmurs and normal pedal pulses bilaterally.  Abdomen Inspection Inspection of the abdomen reveals - No Hernias. Skin - Scar - no surgical scars. Palpation/Percussion Palpation and Percussion of the abdomen reveal - Soft, Non Tender, No Rebound tenderness, No Rigidity (guarding) and No hepatosplenomegaly. Auscultation Auscultation of the abdomen reveals - Bowel sounds normal.  Neurologic Neurologic evaluation reveals -alert and oriented x 3 with no impairment of recent or remote memory. Mental Status-Normal.  Musculoskeletal Normal Exam - Left-Upper Extremity Strength Normal and Lower Extremity Strength Normal. Normal Exam - Right-Upper Extremity Strength Normal and Lower Extremity Strength Normal.  Lymphatic Head & Neck  General Head & Neck Lymphatics: Bilateral - Description - Normal. Axillary  General Axillary Region: Bilateral - Description - Normal. Tenderness - Non Tender. Femoral & Inguinal  Generalized Femoral & Inguinal Lymphatics: Bilateral - Description - Normal. Tenderness - Non Tender.    Assessment & Plan  DUCTAL CARCINOMA IN SITU (DCIS) OF RIGHT BREAST (D05.11) Impression: The patient appears to have a small area of dcis in the upper right breast seen only on MRI. I have  discussed with her the different options for treatment and at this point she favors breast conservation which I feel is a very reasonable way to treat her cancer. I have discussed with her the risks and benefits of the surgery as well as some of the technical aspects including the use of a radioactive seed for localization and she understands and wishes to proceed. she will not need a node evaluation. I have counseled her to stop smoking. This patient encounter took 60 minutes today to perform the following: take history, perform exam, review outside records, interpret imaging, counsel the patient on their diagnosis and document encounter, findings & plan in the EHR Current Plans Referred to Oncology, for evaluation and follow up (Oncology). Routine.

## 2020-09-29 NOTE — Anesthesia Procedure Notes (Signed)
Procedure Name: LMA Insertion Date/Time: 09/29/2020 9:29 AM Performed by: Lauralyn Primes, CRNA Pre-anesthesia Checklist: Patient identified, Emergency Drugs available, Suction available and Patient being monitored Patient Re-evaluated:Patient Re-evaluated prior to induction Oxygen Delivery Method: Circle system utilized Preoxygenation: Pre-oxygenation with 100% oxygen Induction Type: IV induction Ventilation: Mask ventilation without difficulty LMA: LMA inserted LMA Size: 4.0 Number of attempts: 1 Airway Equipment and Method: Bite block Placement Confirmation: positive ETCO2 Tube secured with: Tape Dental Injury: Teeth and Oropharynx as per pre-operative assessment

## 2020-09-29 NOTE — Op Note (Signed)
09/29/2020  10:05 AM  PATIENT:  Brittany Moreno  65 y.o. female  PRE-OPERATIVE DIAGNOSIS:  RIGHT BREAST DCIS  POST-OPERATIVE DIAGNOSIS:  RIGHT BREAST DCIS  PROCEDURE:  Procedure(s): RIGHT BREAST LUMPECTOMY WITH RADIOACTIVE SEED LOCALIZATION (Right)  SURGEON:  Surgeon(s) and Role:    * Griselda Miner, MD - Primary  PHYSICIAN ASSISTANT:   ASSISTANTS: none   ANESTHESIA:   local and general  EBL:  10 mL   BLOOD ADMINISTERED:none  DRAINS: none   LOCAL MEDICATIONS USED:  MARCAINE     SPECIMEN:  Source of Specimen:  right breast tissue  DISPOSITION OF SPECIMEN:  PATHOLOGY  COUNTS:  YES  TOURNIQUET:  * No tourniquets in log *  DICTATION: .Dragon Dictation   After informed consent was obtained the patient was brought to the operating room and placed in the supine position on the operating table.  After adequate induction of general anesthesia the patient's right breast was prepped with ChloraPrep, allowed to dry, and draped in usual sterile manner.  An appropriate timeout was performed.  Previously an I-125 seed was placed in the upper outer aspect of the right breast to mark an area of ductal carcinoma in situ.  The neoprobe was set to I-125 in the area of radioactivity was readily identified.  The area around this was infiltrated with quarter percent Marcaine.  A curvilinear incision was made overlying the area of radioactivity with a 15 blade knife.  The incision was carried through the skin and subcutaneous tissue sharply with the electrocautery.  Dissection was then carried towards the radioactive seed under the direction of the neoprobe.  Once I more closely approached the radioactive seed I then removed a circular portion of breast tissue sharply with the electrocautery around the radioactive seed while checking the area of radioactivity frequently.  Once the specimen was removed it was oriented with the appropriate paint colors.  A specimen radiograph was obtained that showed the  clip and seed to be near the center of the specimen.  The specimen was then sent to pathology for further evaluation.  Hemostasis was achieved using the Bovie electrocautery.  The cavity was marked with clips.  The wound was irrigated with saline and infiltrated with more quarter percent Marcaine.  The deep layer of the wound was then closed with layers of interrupted 3-0 Vicryl stitches.  The skin was closed with a running 4-0 Monocryl subcuticular stitch.  Dermabond dressings were applied.  The patient tolerated the procedure well.  At the end of the case all needle sponge and instrument counts were correct.  The patient was then awakened and taken to recovery in stable condition.  PLAN OF CARE: Discharge to home after PACU  PATIENT DISPOSITION:  PACU - hemodynamically stable.   Delay start of Pharmacological VTE agent (>24hrs) due to surgical blood loss or risk of bleeding: not applicable

## 2020-09-29 NOTE — Transfer of Care (Signed)
Immediate Anesthesia Transfer of Care Note  Patient: Brittany Moreno  Procedure(s) Performed: RIGHT BREAST LUMPECTOMY WITH RADIOACTIVE SEED LOCALIZATION (Right Breast)  Patient Location: PACU  Anesthesia Type:General  Level of Consciousness: awake, alert  and oriented  Airway & Oxygen Therapy: Patient Spontanous Breathing and Patient connected to face mask oxygen  Post-op Assessment: Report given to RN and Post -op Vital signs reviewed and stable  Post vital signs: Reviewed and stable  Last Vitals:  Vitals Value Taken Time  BP 156/97 09/29/20 1015  Temp    Pulse 110 09/29/20 1016  Resp 18 09/29/20 1016  SpO2 100 % 09/29/20 1016  Vitals shown include unvalidated device data.  Last Pain:  Vitals:   09/29/20 0729  TempSrc: Oral  PainSc: 0-No pain      Patients Stated Pain Goal: 3 (09/29/20 0729)  Complications: No complications documented.

## 2020-10-02 ENCOUNTER — Encounter (HOSPITAL_BASED_OUTPATIENT_CLINIC_OR_DEPARTMENT_OTHER): Payer: Self-pay | Admitting: General Surgery

## 2020-10-03 ENCOUNTER — Encounter: Payer: Self-pay | Admitting: *Deleted

## 2020-10-03 LAB — SURGICAL PATHOLOGY

## 2020-10-05 ENCOUNTER — Ambulatory Visit: Payer: Medicare Other | Admitting: Radiation Oncology

## 2020-10-05 ENCOUNTER — Ambulatory Visit: Payer: Medicare Other

## 2020-10-31 ENCOUNTER — Other Ambulatory Visit: Payer: Self-pay

## 2020-10-31 ENCOUNTER — Ambulatory Visit
Admission: RE | Admit: 2020-10-31 | Discharge: 2020-10-31 | Disposition: A | Discharge status: Home / Self Care | Payer: Medicare Other | Source: Ambulatory Visit | Attending: Radiation Oncology | Admitting: Radiation Oncology

## 2020-10-31 ENCOUNTER — Encounter: Payer: Self-pay | Admitting: Radiation Oncology

## 2020-10-31 VITALS — BP 119/72 | HR 78 | Temp 98.1°F | Resp 18 | Wt 156.6 lb

## 2020-10-31 DIAGNOSIS — Z79899 Other long term (current) drug therapy: Secondary | ICD-10-CM | POA: Diagnosis not present

## 2020-10-31 DIAGNOSIS — Z801 Family history of malignant neoplasm of trachea, bronchus and lung: Secondary | ICD-10-CM | POA: Diagnosis not present

## 2020-10-31 DIAGNOSIS — Z808 Family history of malignant neoplasm of other organs or systems: Secondary | ICD-10-CM | POA: Diagnosis not present

## 2020-10-31 DIAGNOSIS — Z803 Family history of malignant neoplasm of breast: Secondary | ICD-10-CM | POA: Insufficient documentation

## 2020-10-31 DIAGNOSIS — D0511 Intraductal carcinoma in situ of right breast: Secondary | ICD-10-CM

## 2020-10-31 DIAGNOSIS — E7800 Pure hypercholesterolemia, unspecified: Secondary | ICD-10-CM | POA: Insufficient documentation

## 2020-10-31 DIAGNOSIS — I10 Essential (primary) hypertension: Secondary | ICD-10-CM | POA: Insufficient documentation

## 2020-10-31 DIAGNOSIS — Z170 Estrogen receptor positive status [ER+]: Secondary | ICD-10-CM | POA: Diagnosis not present

## 2020-10-31 DIAGNOSIS — Z8052 Family history of malignant neoplasm of bladder: Secondary | ICD-10-CM | POA: Insufficient documentation

## 2020-10-31 DIAGNOSIS — F17210 Nicotine dependence, cigarettes, uncomplicated: Secondary | ICD-10-CM | POA: Diagnosis not present

## 2020-10-31 DIAGNOSIS — Z7982 Long term (current) use of aspirin: Secondary | ICD-10-CM | POA: Insufficient documentation

## 2020-10-31 NOTE — Progress Notes (Signed)
Radiation Oncology         [phone] ________________________________  Name: Brittany Moreno        MRN: [id number]  Date of Service: 10/31/2020 DOB: 1955-12-01  CC:Courtney Paris, NP  Malachy Mood, MD     REFERRING PHYSICIAN: Malachy Mood, MD   DIAGNOSIS: The encounter diagnosis was Ductal carcinoma in situ (DCIS) of right breast.   HISTORY OF PRESENT ILLNESS: Brittany Moreno is a 65 y.o. female originally seen in the multidisciplinary breast clinic for a new diagnosis of right breast cancer. The patient was noted to have a screening MRI due to family history of cancer and dense breast tissue. She had a normal mammogram in November 2021. Her MRI however showed a new area of clumped nonmass enhancement that measured 1.4 cm in the 12:00 position. A biopsy on 07/26/20 showed an intermediate grade DCIS that was ER/PR positive. The lesion did arise in a complex sclerosing lesion.   Since her last visit, the patient has undergone right lumpectomy on 09/29/2020, final pathology revealed low-grade DCIS within a complex sclerosing lesion, the DCIS measured 1.5 cm and her margins were all clear.  She is seen today to discuss treatment recommendations for adjuvant therapy.    PREVIOUS RADIATION THERAPY: No   PAST MEDICAL HISTORY:  Past Medical History:  Diagnosis Date  . Family history of basal cell carcinoma   . Family history of breast cancer   . Family history of kidney cancer   . Family history of lung cancer   . Family history of thyroid cancer   . High cholesterol   . History of basal cell carcinoma   . Hypertension        PAST SURGICAL HISTORY: Past Surgical History:  Procedure Laterality Date  . ABDOMINAL HYSTERECTOMY    . BREAST LUMPECTOMY WITH RADIOACTIVE SEED LOCALIZATION Right 09/29/2020   Procedure: RIGHT BREAST LUMPECTOMY WITH RADIOACTIVE SEED LOCALIZATION;  Surgeon: Griselda Miner, MD;  Location: [facility];  Service: General;  Laterality: Right;  . CARPAL  TUNNEL RELEASE    . MANDIBLE SURGERY    . ROTATOR CUFF REPAIR    . TUBAL LIGATION       FAMILY HISTORY:  Family History  Problem Relation Age of Onset  . Hypertension Mother   . Diabetes Mother   . Hypertension Father   . Aneurysm Father   . Hypertension Sister   . Diabetes Sister   . Breast cancer Sister 96  . Kidney cancer Sister        dx <61, unilateral  . Crohn's disease Sister   . Anuerysm Sister   . Breast cancer Maternal Aunt        unknown age  . Thyroid cancer Maternal Aunt        unknown age  . Lung cancer Maternal Uncle        dx >50, worked in [employer], smoker  . Basal cell carcinoma Son 25       multiple (back, face, shoulder, leg), +sun exposure     SOCIAL HISTORY:  reports that she has been smoking cigarettes. She has a 50.00 pack-year smoking history. She has never used smokeless tobacco. She reports current alcohol use of about 36.0 standard drinks of alcohol per week. She reports that she does not use drugs. the patinet is married and lives in [place]. 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 She and her husband care for their grand daughter, and great  granddaughter.    ALLERGIES: Bactrim [sulfamethoxazole-trimethoprim], Losartan, Erythromycin, Penicillins, Prednisone, and Percocet [oxycodone-acetaminophen]   MEDICATIONS:  Current Outpatient Medications  Medication Sig Dispense Refill  . aspirin EC 81 MG tablet Take 325 mg by mouth daily.    Marland Kitchen guaiFENesin-codeine 100-10 MG/5ML syrup Take 10 mLs by mouth every 6 (six) hours as needed.    Marland Kitchen lisinopril-hydrochlorothiazide (ZESTORETIC) 20-12.5 MG tablet Take 1 tablet by mouth daily.    Marland Kitchen loratadine (CLARITIN) 10 MG tablet Take 10 mg by mouth daily.    . sertraline (ZOLOFT) 25 MG tablet Take 25 mg by mouth daily.    . traMADol (ULTRAM) 50 MG tablet Take 1-2 tablets (50-100 mg total) by mouth every 6 (six) hours as needed. 20 tablet 0   No current  facility-administered medications for this encounter.     REVIEW OF SYSTEMS: On review of systems, the patient is doing well overall. She denies any concerns with her breast healing at this time.     PHYSICAL EXAM:  Wt Readings from Last 3 Encounters:  09/29/20 155 lb 6.8 oz (70.5 kg)  08/02/20 159 lb 4.8 oz (72.3 kg)  06/15/18 166 lb (75.3 kg)   Temp Readings from Last 3 Encounters:  09/29/20 (!) 97.3 F (36.3 C)  08/02/20 97.9 F (36.6 C) (Tympanic)  02/14/20 98 F (36.7 C) (Oral)   BP Readings from Last 3 Encounters:  09/29/20 (!) 152/87  08/02/20 121/80  02/14/20 (!) 158/96   Pulse Readings from Last 3 Encounters:  09/29/20 85  08/02/20 70  02/14/20 78    In general this is a well appearing caucasian female in no acute distress. She's alert and oriented x4 and appropriate throughout the examination. Cardiopulmonary assessment is negative for acute distress and she exhibits normal effort.  The patient's right breast incision site is well-healed, there is no erythema, separation or bleeding.    ECOG = 0  0 - Asymptomatic (Fully active, able to carry on all predisease activities without restriction)  1 - Symptomatic but completely ambulatory (Restricted in physically strenuous activity but ambulatory and able to carry out work of a light or sedentary nature. For example, light housework, office work)  2 - Symptomatic, <50% in bed during the day (Ambulatory and capable of all self care but unable to carry out any work activities. Up and about more than 50% of waking hours)  3 - Symptomatic, >50% in bed, but not bedbound (Capable of only limited self-care, confined to bed or chair 50% or more of waking hours)  4 - Bedbound (Completely disabled. Cannot carry on any self-care. Totally confined to bed or chair)  5 - Death   Santiago Glad MM, Creech RH, Tormey DC, et al. [phone]). "Toxicity and response criteria of the [facility]". Am. Evlyn Clines. Oncol. 5 (6):  649-55    LABORATORY DATA:  Lab Results  Component Value Date   WBC 6.6 08/02/2020   HGB 15.8 (H) 08/02/2020   HCT 47.4 (H) 08/02/2020   MCV 95.4 08/02/2020   PLT 275 08/02/2020   Lab Results  Component Value Date   NA 131 (L) 09/26/2020   K 4.1 09/26/2020   CL 97 (L) 09/26/2020   CO2 24 09/26/2020   Lab Results  Component Value Date   ALT 19 08/02/2020   AST 20 08/02/2020   ALKPHOS 76 08/02/2020   BILITOT 0.5 08/02/2020      RADIOGRAPHY: No results found.     IMPRESSION/PLAN: 1. Low grade, ER/PR positive  DCIS of the right breast. Dr. Mitzi Hansen discusses the final pathology findings and reviews the nature of right breast disease.  She is doing well surgically, and is ready to proceed with radiotherapy.  We reviewed the rationale for external radiotherapy to the breast  to reduce risks of local recurrence followed by antiestrogen therapy. We discussed the risks, benefits, short, and long term effects of radiotherapy, as well as the curative intent, and the patient is interested in proceeding. Dr. Mitzi Hansen discusses the delivery and logistics of radiotherapy and recommends 4 weeks of radiotherapy. Written consent is obtained and placed in the chart, a copy was provided to the patient.  She will simulate this morning.  In a visit lasting 45 minutes, greater than 50% of the time was spent face to face reviewing her case, as well as in preparation of, discussing, and coordinating the patient's care.  The above documentation reflects my direct findings during this shared patient visit. Please see the separate note by Dr. Mitzi Hansen on this date for the remainder of the patient's plan of care.    Osker Mason, [facility]    **Disclaimer: This note was dictated with voice recognition software. Similar sounding words can inadvertently be transcribed and this note may contain transcription errors which may not have been corrected upon publication of note.**

## 2020-10-31 NOTE — Progress Notes (Signed)
New Breast Cancer Diagnosis: Right Breast  Histology per Pathology Report:  09/29/2020   07/26/2020   Surgeon and surgical plan, if any: Dr. Carolynne Edouard -Right Breast Lumpectomy with radioactive seed localization 09/29/2020      Family History of Breast/Ovarian/Prostate Cancer: Sister had breast, Maternal Aunt had breast.Sister had abnormal cells had total hysterectomy.  Lymphedema issues, if any: No   Pain issues, if any: No  SAFETY ISSUES: Prior radiation? No Pacemaker/ICD? No Possible current pregnancy? Hysterectomy Is the patient on methotrexate? No  Current Complaints / other details:

## 2020-11-06 ENCOUNTER — Encounter: Payer: Self-pay | Admitting: *Deleted

## 2020-11-07 ENCOUNTER — Telehealth: Payer: Self-pay | Admitting: Hematology

## 2020-11-07 NOTE — Telephone Encounter (Signed)
Per 6/6 sch msg, pt aware 

## 2020-11-09 ENCOUNTER — Encounter: Payer: Self-pay | Admitting: *Deleted

## 2020-11-10 DIAGNOSIS — D0511 Intraductal carcinoma in situ of right breast: Secondary | ICD-10-CM | POA: Insufficient documentation

## 2020-11-13 ENCOUNTER — Ambulatory Visit
Admission: RE | Admit: 2020-11-13 | Discharge: 2020-11-13 | Disposition: A | Discharge status: Home / Self Care | Payer: Medicare Other | Source: Ambulatory Visit | Attending: Radiation Oncology | Admitting: Radiation Oncology

## 2020-11-13 ENCOUNTER — Other Ambulatory Visit: Payer: Self-pay

## 2020-11-13 DIAGNOSIS — D0511 Intraductal carcinoma in situ of right breast: Secondary | ICD-10-CM | POA: Diagnosis not present

## 2020-11-14 ENCOUNTER — Ambulatory Visit
Admission: RE | Admit: 2020-11-14 | Discharge: 2020-11-14 | Disposition: A | Discharge status: Home / Self Care | Payer: Medicare Other | Source: Ambulatory Visit | Attending: Radiation Oncology | Admitting: Radiation Oncology

## 2020-11-14 DIAGNOSIS — D0511 Intraductal carcinoma in situ of right breast: Secondary | ICD-10-CM | POA: Diagnosis not present

## 2020-11-15 ENCOUNTER — Other Ambulatory Visit: Payer: Self-pay

## 2020-11-15 ENCOUNTER — Ambulatory Visit
Admission: RE | Admit: 2020-11-15 | Discharge: 2020-11-15 | Disposition: A | Discharge status: Home / Self Care | Payer: Medicare Other | Source: Ambulatory Visit | Attending: Radiation Oncology | Admitting: Radiation Oncology

## 2020-11-15 DIAGNOSIS — D0511 Intraductal carcinoma in situ of right breast: Secondary | ICD-10-CM | POA: Diagnosis not present

## 2020-11-16 ENCOUNTER — Ambulatory Visit
Admission: RE | Admit: 2020-11-16 | Discharge: 2020-11-16 | Disposition: A | Discharge status: Home / Self Care | Payer: Medicare Other | Source: Ambulatory Visit | Attending: Radiation Oncology | Admitting: Radiation Oncology

## 2020-11-16 DIAGNOSIS — D0511 Intraductal carcinoma in situ of right breast: Secondary | ICD-10-CM | POA: Diagnosis not present

## 2020-11-16 NOTE — Progress Notes (Signed)

## 2020-11-17 ENCOUNTER — Other Ambulatory Visit: Payer: Self-pay

## 2020-11-17 ENCOUNTER — Ambulatory Visit
Admission: RE | Admit: 2020-11-17 | Discharge: 2020-11-17 | Disposition: A | Discharge status: Home / Self Care | Payer: Medicare Other | Source: Ambulatory Visit | Attending: Radiation Oncology | Admitting: Radiation Oncology

## 2020-11-17 DIAGNOSIS — D0511 Intraductal carcinoma in situ of right breast: Secondary | ICD-10-CM | POA: Diagnosis not present

## 2020-11-17 MED ORDER — RADIAPLEXRX EX GEL: Freq: Once | CUTANEOUS | Status: AC

## 2020-11-17 MED ORDER — ALRA NON-METALLIC DEODORANT (RAD-ONC)
1.0000 | Freq: Once | TOPICAL | Status: AC
Start: 2020-11-17 — End: 2020-11-17
  Administered 2020-11-17: 1 via TOPICAL

## 2020-11-20 ENCOUNTER — Other Ambulatory Visit: Payer: Self-pay

## 2020-11-20 ENCOUNTER — Ambulatory Visit
Admission: RE | Admit: 2020-11-20 | Discharge: 2020-11-20 | Disposition: A | Discharge status: Home / Self Care | Payer: Medicare Other | Source: Ambulatory Visit | Attending: Radiation Oncology | Admitting: Radiation Oncology

## 2020-11-20 DIAGNOSIS — D0511 Intraductal carcinoma in situ of right breast: Secondary | ICD-10-CM | POA: Diagnosis not present

## 2020-11-21 ENCOUNTER — Ambulatory Visit
Admission: RE | Admit: 2020-11-21 | Discharge: 2020-11-21 | Disposition: A | Discharge status: Home / Self Care | Payer: Medicare Other | Source: Ambulatory Visit | Attending: Radiation Oncology | Admitting: Radiation Oncology

## 2020-11-21 DIAGNOSIS — D0511 Intraductal carcinoma in situ of right breast: Secondary | ICD-10-CM | POA: Diagnosis not present

## 2020-11-22 ENCOUNTER — Ambulatory Visit
Admission: RE | Admit: 2020-11-22 | Discharge: 2020-11-22 | Disposition: A | Discharge status: Home / Self Care | Payer: Medicare Other | Source: Ambulatory Visit | Attending: Radiation Oncology | Admitting: Radiation Oncology

## 2020-11-22 ENCOUNTER — Other Ambulatory Visit: Payer: Self-pay

## 2020-11-22 DIAGNOSIS — D0511 Intraductal carcinoma in situ of right breast: Secondary | ICD-10-CM | POA: Diagnosis not present

## 2020-11-23 ENCOUNTER — Ambulatory Visit
Admission: RE | Admit: 2020-11-23 | Discharge: 2020-11-23 | Disposition: A | Discharge status: Home / Self Care | Payer: Medicare Other | Source: Ambulatory Visit | Attending: Radiation Oncology | Admitting: Radiation Oncology

## 2020-11-23 DIAGNOSIS — D0511 Intraductal carcinoma in situ of right breast: Secondary | ICD-10-CM | POA: Diagnosis not present

## 2020-11-24 ENCOUNTER — Other Ambulatory Visit: Payer: Self-pay

## 2020-11-24 ENCOUNTER — Ambulatory Visit
Admission: RE | Admit: 2020-11-24 | Discharge: 2020-11-24 | Disposition: A | Discharge status: Home / Self Care | Payer: Medicare Other | Source: Ambulatory Visit | Attending: Radiation Oncology | Admitting: Radiation Oncology

## 2020-11-24 DIAGNOSIS — D0511 Intraductal carcinoma in situ of right breast: Secondary | ICD-10-CM | POA: Diagnosis not present

## 2020-11-27 ENCOUNTER — Ambulatory Visit
Admission: RE | Admit: 2020-11-27 | Discharge: 2020-11-27 | Disposition: A | Discharge status: Home / Self Care | Payer: Medicare Other | Source: Ambulatory Visit | Attending: Radiation Oncology | Admitting: Radiation Oncology

## 2020-11-27 ENCOUNTER — Other Ambulatory Visit: Payer: Self-pay

## 2020-11-27 DIAGNOSIS — D0511 Intraductal carcinoma in situ of right breast: Secondary | ICD-10-CM | POA: Diagnosis not present

## 2020-11-28 ENCOUNTER — Other Ambulatory Visit: Payer: Self-pay

## 2020-11-28 ENCOUNTER — Ambulatory Visit
Admission: RE | Admit: 2020-11-28 | Discharge: 2020-11-28 | Disposition: A | Discharge status: Home / Self Care | Payer: Medicare Other | Source: Ambulatory Visit | Attending: Radiation Oncology | Admitting: Radiation Oncology

## 2020-11-28 DIAGNOSIS — D0511 Intraductal carcinoma in situ of right breast: Secondary | ICD-10-CM | POA: Diagnosis not present

## 2020-11-28 NOTE — Progress Notes (Signed)
[facility]   Telephone:([phone]) [phone] Fax:([phone]) [phone]   Clinic Follow up Note   Patient Care Team: Simona Huh, NP as PCP - General (Nurse Practitioner) Rockwell Germany, RN as Oncology Nurse Navigator Mauro Kaufmann, RN as Oncology Nurse Navigator Jovita Kussmaul, MD as Consulting Physician (General Surgery) [name], MD as Consulting Physician (Hematology) Kyung Rudd, MD as Consulting Physician (Radiation Oncology)  Date of Service:  11/29/2020  CHIEF COMPLAINT: F/u of right breast DCIS  SUMMARY OF ONCOLOGIC HISTORY: Oncology History Overview Note  Cancer Staging Ductal carcinoma in situ (DCIS) of right breast Staging form: Breast, AJCC 8th Edition - Clinical stage from 07/26/2020: Stage 0 (cTis (DCIS), cN0, cM0, G1, ER+, PR+, HER2: Brittany Assessed) - Signed by [name], MD on 08/01/2020 Stage prefix: Initial diagnosis    Ductal carcinoma in situ (DCIS) of right breast  07/18/2020 Breast MRI   IMPRESSION: Indeterminate clumped non-mass enhancement involving the UPPER central RIGHT breast spanning 1.4 x 1.2 x 1.3  cm maximally.   07/26/2020 Cancer Staging   Staging form: Breast, AJCC 8th Edition - Clinical stage from 07/26/2020: Stage 0 (cTis (DCIS), cN0, cM0, G1, ER+, PR+, HER2: Brittany Assessed) - Signed by [name], MD on 08/01/2020  Stage prefix: Initial diagnosis    07/26/2020 Initial Biopsy   Diagnosis Breast, right, needle core biopsy, upper inner - DUCTAL CARCINOMA IN SITU INVOLVING COMPLEX SCLEROSING LESION WITH CALCIFICATIONS. Microscopic Comment The DCIS is of intermediate nuclear grade with focal necrosis. E-cadherin is positive and CK5/6 is negative in DCIS. P63, Calponin and SMM-1 demonstrate the presence of myoepithelium throughout. Ancillary studies will be reported separately. Results reported to [facility] on 07/27/2020. Dr. Tresa Moore reviewed the case.   07/26/2020 Receptors her2    Results: IMMUNOHISTOCHEMICAL AND  MORPHOMETRIC ANALYSIS PERFORMED MANUALLY Estrogen Receptor: 95%, POSITIVE, STRONG STAINING INTENSITY Progesterone Receptor: 15%, POSITIVE, STRONG STAINING INTENSITY   07/31/2020 Initial Diagnosis   Ductal carcinoma in situ (DCIS) of right breast    08/21/2020 Genetic Testing   Negative genetic testing:  No pathogenic variants detected on the [employer] CancerNext-Expanded + RNAinsight panel. The report date is 08/21/2020.   The CancerNext-Expanded + RNAinsight gene panel offered by [employer] and includes sequencing and rearrangement analysis for the following 77 genes: AIP, ALK, APC, ATM, AXIN2, BAP1, BARD1, BLM, BMPR1A, BRCA1, BRCA2, BRIP1, CDC73, CDH1, CDK4, CDKN1B, CDKN2A, CHEK2, CTNNA1, DICER1, FANCC, FH, FLCN, GALNT12, KIF1B, LZTR1, MAX, MEN1, MET, MLH1, MSH2, MSH3, MSH6, MUTYH, NBN, NF1, NF2, NTHL1, PALB2, PHOX2B, PMS2, POT1, PRKAR1A, PTCH1, PTEN, RAD51C, RAD51D, RB1, RECQL, RET, SDHA, SDHAF2, SDHB, SDHC, SDHD, SMAD4, SMARCA4, SMARCB1, SMARCE1, STK11, SUFU, TMEM127, TP53, TSC1, TSC2, VHL and XRCC2 (sequencing and deletion/duplication); EGFR, EGLN1, HOXB13, KIT, MITF, PDGFRA, POLD1 and POLE (sequencing only); EPCAM and GREM1 (deletion/duplication only). RNA data is routinely analyzed for use in variant interpretation for all genes.   09/29/2020 Surgery   RIGHT BREAST LUMPECTOMY WITH RADIOACTIVE SEED LOCALIZATION by Dr Marlou Starks   FINAL MICROSCOPIC DIAGNOSIS:   A. 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 Lisbeth Renshaw 11/13/20-12/11/20  INTERVAL HISTORY: Brittany Moreno is  here for a follow up of right breast DCIS. She was last seen by me 3 months ago. She presents to the  clinic alone. She notes no issues with tolerating radiation. She does note some nausea and fatigue today, but Brittany too bothersome. She notes some skin redness and irritation, but denies any skin peeling. This is only painful to the touch. She is scheduled to finish her radiation July 11.   REVIEW OF SYSTEMS:  Constitutional: Denies fevers, chills or abnormal weight loss. Mild fatigue. Eyes: Denies blurriness of vision Ears, nose, mouth, throat, and face: Denies mucositis or sore throat Respiratory: Denies cough, dyspnea or wheezes Cardiovascular: Denies palpitation, chest discomfort or lower extremity swelling Gastrointestinal:  Denies heartburn or change in bowel habits. Mild nausea today. Skin: Denies abnormal skin rashes Lymphatics: Denies new lymphadenopathy or easy bruising Neurological:Denies numbness, tingling or new weaknesses Behavioral/Psych: Mood is stable, no new changes  All other systems were reviewed with the patient and are negative.  MEDICAL HISTORY:  Past Medical History:  Diagnosis Date   Family history of basal cell carcinoma    Family history of breast cancer    Family history of kidney cancer    Family history of lung cancer    Family history of thyroid cancer    High cholesterol    History of basal cell carcinoma    Hypertension     SURGICAL HISTORY: Past Surgical History:  Procedure Laterality Date   ABDOMINAL HYSTERECTOMY     BREAST LUMPECTOMY WITH RADIOACTIVE SEED LOCALIZATION Right 09/29/2020   Procedure: RIGHT BREAST LUMPECTOMY WITH RADIOACTIVE SEED LOCALIZATION;  Surgeon: Jovita Kussmaul, MD;  Location: [facility];  Service: General;  Laterality: Right;   Jane      I have reviewed the social history and family history with the patient and they are unchanged from previous note.  ALLERGIES:  is allergic to bactrim [sulfamethoxazole-trimethoprim], losartan,  erythromycin, penicillins, prednisone, and percocet [oxycodone-acetaminophen].  MEDICATIONS:  Current Outpatient Medications  Medication Sig Dispense Refill   tamoxifen (NOLVADEX) 20 MG tablet Take 1 tablet (20 mg total) by mouth daily. 30 tablet 3   aspirin EC 81 MG tablet Take 325 mg by mouth daily.     guaiFENesin-codeine 100-10 MG/5ML syrup Take 10 mLs by mouth every 6 (six) hours as needed. (Patient Brittany taking: Reported on 10/31/2020)     ipratropium (ATROVENT) 0.03 % nasal spray Place 2 sprays into both nostrils 2 (two) times daily. (Patient Brittany taking: Reported on 10/31/2020)     levocetirizine (XYZAL) 5 MG tablet Take 2.5 mg by mouth every evening.     lisinopril-hydrochlorothiazide (ZESTORETIC) 20-12.5 MG tablet Take 1 tablet by mouth daily.     traMADol (ULTRAM) 50 MG tablet Take 1-2 tablets (50-100 mg total) by mouth every 6 (six) hours as needed. (Patient Brittany taking: Reported on 10/31/2020) 20 tablet 0   No current facility-administered medications for this visit.    PHYSICAL EXAMINATION: ECOG PERFORMANCE STATUS: 1 - Symptomatic but completely ambulatory  Vitals:   11/29/20 1407  BP: 125/77  Pulse: 79  Resp: 17  Temp: 97.6 F (36.4 C)  SpO2: 98%   Filed Weights   11/29/20 1407  Weight: 155 lb 4.8 oz (70.4 kg)    GENERAL:alert, no distress and comfortable SKIN: skin color, texture, turgor are normal, no rashes or significant lesions EYES: normal, Conjunctiva are  pink and non-injected, sclera clear NEURO: alert & oriented x 3 with fluent speech, no focal motor/sensory deficits Breast exam deferred today   LABORATORY DATA:  I have reviewed the data as listed CBC Latest Ref Rng & Units 08/02/2020 01/04/2019 06/14/2018  WBC 4.0 - 10.5 K/uL 6.6 7.2 13.8(H)  Hemoglobin 12.0 - 15.0 g/dL 15.8(H) 14.4 14.0  Hematocrit 36.0 - 46.0 % 47.4(H) 43.5 43.2  Platelets 150 - 400 K/uL 275 289 204     CMP Latest Ref Rng & Units 09/26/2020 08/28/2020 08/02/2020  Glucose 70 - 99 mg/dL  114(H) 100(H) 101(H)  BUN 8 - 23 mg/dL 7(L) 5(L) 9  Creatinine 0.44 - 1.00 mg/dL 0.68 0.65 0.80  Sodium 135 - 145 mmol/L 131(L) 133(L) 136  Potassium 3.5 - 5.1 mmol/L 4.1 4.8 4.7  Chloride 98 - 111 mmol/L 97(L) 98 99  CO2 22 - 32 mmol/L '24 26 27  ' Calcium 8.9 - 10.3 mg/dL 8.8(L) 9.4 9.6  Total Protein 6.5 - 8.1 g/dL - - 7.4  Total Bilirubin 0.3 - 1.2 mg/dL - - 0.5  Alkaline Phos 38 - 126 U/L - - 76  AST 15 - 41 U/L - - 20  ALT 0 - 44 U/L - - 19      RADIOGRAPHIC STUDIES: I have personally reviewed the radiological images as listed and agreed with the findings in the report. No results found.   ASSESSMENT & PLAN:  KAYLIN MARCON is a 65 y.o. female with   1. Right breast DCIS, low grade,  ER+/PR+ -I reviewed her surgical pathology, which showed a low-grade DCIS, with negative margins. -She is on adjuvant radiation, tolerating well. -Given her strongly positive ER and PR, I do recommend antiestrogen therapy with Tamoxifen to further reduce her risk of recurrence  ---The potential side effects, which includes but Brittany limited to, hot flash, skin and vaginal dryness, slightly increased risk of cardiovascular disease and cataract, small risk of thrombosis and endometrial cancer, were discussed with her in great details. Preventive strategies for thrombosis, such as being physically active, using compression stocks, avoid cigarette smoking, etc., were reviewed with her.  She has had hysterectomy, no risk of endometrial cancer.  She voiced good understanding, and agrees to proceed. Will start in early August  -Will [facility] to establish baseline.  -Survivorship in 3 to 4 months  2. 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 I discussed reducing use or stopping completely as this can lead to liver complications with long term use. -She smokes 1ppd for the past 50 years. She has been counseled on smoking cessation before and  tried to quit using multiple methods without success. -She has started lung cancer screening in 01/2020. Will continue to scan yearly. -Recommended again for pt to work towards smoking cessation.  She tried to quit in the past, but was Brittany successful.      PLAN:  -Will start Tamoxifen in August, 2022 -Will get Bone Density and Mammogram on November 18 at [facility]. -F/u in 3-4 months with NP Lacie (Survivorship clinic) and MD visit and labs in 7 months.      No problem-specific Assessment & Plan notes found for this encounter.   Orders Placed This Encounter  Procedures   DG Bone Density    Standing Status:   Future    Standing Expiration Date:   11/29/2021    Scheduling Instructions:     [facility]  Order Specific Question:   Reason for Exam (SYMPTOM  OR DIAGNOSIS REQUIRED)    Answer:   screening    Order Specific Question:   Preferred imaging location?    Answer:   External   MM DIAG BREAST TOMO BILATERAL    Standing Status:   Future    Standing Expiration Date:   11/29/2021    Scheduling Instructions:     [facility]    Order Specific Question:   Reason for Exam (SYMPTOM  OR DIAGNOSIS REQUIRED)    Answer:   screening    Order Specific Question:   Preferred imaging location?    Answer:   External    All questions were answered. The patient knows to call the clinic with any problems, questions or concerns. No barriers to learning was detected. The total time spent in the appointment was 30 minutes.     [name], MD 11/29/2020   I, Reinaldo Raddle, am acting as scribe for Dr. [name], MD.

## 2020-11-29 ENCOUNTER — Ambulatory Visit
Admission: RE | Admit: 2020-11-29 | Discharge: 2020-11-29 | Disposition: A | Discharge status: Home / Self Care | Payer: Medicare Other | Source: Ambulatory Visit | Attending: Radiation Oncology | Admitting: Radiation Oncology

## 2020-11-29 ENCOUNTER — Inpatient Hospital Stay: Payer: Medicare Other | Attending: Hematology | Admitting: Hematology

## 2020-11-29 ENCOUNTER — Encounter: Payer: Self-pay | Admitting: Hematology

## 2020-11-29 VITALS — BP 125/77 | HR 79 | Temp 97.6°F | Resp 17 | Ht 65.0 in | Wt 155.3 lb

## 2020-11-29 DIAGNOSIS — Z8051 Family history of malignant neoplasm of kidney: Secondary | ICD-10-CM | POA: Diagnosis not present

## 2020-11-29 DIAGNOSIS — Z7289 Other problems related to lifestyle: Secondary | ICD-10-CM | POA: Diagnosis not present

## 2020-11-29 DIAGNOSIS — Z808 Family history of malignant neoplasm of other organs or systems: Secondary | ICD-10-CM | POA: Insufficient documentation

## 2020-11-29 DIAGNOSIS — Z803 Family history of malignant neoplasm of breast: Secondary | ICD-10-CM | POA: Insufficient documentation

## 2020-11-29 DIAGNOSIS — Z90710 Acquired absence of both cervix and uterus: Secondary | ICD-10-CM | POA: Diagnosis not present

## 2020-11-29 DIAGNOSIS — D0511 Intraductal carcinoma in situ of right breast: Secondary | ICD-10-CM | POA: Diagnosis present

## 2020-11-29 DIAGNOSIS — F17210 Nicotine dependence, cigarettes, uncomplicated: Secondary | ICD-10-CM | POA: Insufficient documentation

## 2020-11-29 DIAGNOSIS — E2839 Other primary ovarian failure: Secondary | ICD-10-CM

## 2020-11-29 DIAGNOSIS — Z170 Estrogen receptor positive status [ER+]: Secondary | ICD-10-CM | POA: Insufficient documentation

## 2020-11-29 DIAGNOSIS — Z801 Family history of malignant neoplasm of trachea, bronchus and lung: Secondary | ICD-10-CM | POA: Diagnosis not present

## 2020-11-29 MED ORDER — TAMOXIFEN CITRATE 20 MG PO TABS: 20.0000 mg | ORAL_TABLET | Freq: Every day | ORAL | 3 refills | Status: DC

## 2020-11-30 ENCOUNTER — Ambulatory Visit
Admission: RE | Admit: 2020-11-30 | Discharge: 2020-11-30 | Disposition: A | Discharge status: Home / Self Care | Payer: Medicare Other | Source: Ambulatory Visit | Attending: Radiation Oncology | Admitting: Radiation Oncology

## 2020-11-30 ENCOUNTER — Other Ambulatory Visit: Payer: Self-pay

## 2020-11-30 DIAGNOSIS — D0511 Intraductal carcinoma in situ of right breast: Secondary | ICD-10-CM | POA: Diagnosis not present

## 2020-12-01 ENCOUNTER — Ambulatory Visit
Admission: RE | Admit: 2020-12-01 | Discharge: 2020-12-01 | Disposition: A | Discharge status: Home / Self Care | Payer: Medicare Other | Source: Ambulatory Visit | Attending: Radiation Oncology | Admitting: Radiation Oncology

## 2020-12-01 DIAGNOSIS — D0511 Intraductal carcinoma in situ of right breast: Secondary | ICD-10-CM | POA: Diagnosis present

## 2020-12-04 ENCOUNTER — Encounter: Payer: Self-pay | Admitting: Radiation Oncology

## 2020-12-05 ENCOUNTER — Ambulatory Visit: Payer: Medicare Other | Admitting: Radiation Oncology

## 2020-12-05 ENCOUNTER — Other Ambulatory Visit: Payer: Self-pay

## 2020-12-05 ENCOUNTER — Ambulatory Visit
Admission: RE | Admit: 2020-12-05 | Discharge: 2020-12-05 | Disposition: A | Discharge status: Home / Self Care | Payer: Medicare Other | Source: Ambulatory Visit | Attending: Radiation Oncology | Admitting: Radiation Oncology

## 2020-12-05 ENCOUNTER — Telehealth: Payer: Self-pay | Admitting: Hematology

## 2020-12-05 DIAGNOSIS — D0511 Intraductal carcinoma in situ of right breast: Secondary | ICD-10-CM | POA: Diagnosis not present

## 2020-12-05 NOTE — Telephone Encounter (Signed)
Scheduled follow-up appointments per 6/29 los. Patient is aware. 

## 2020-12-06 ENCOUNTER — Ambulatory Visit
Admission: RE | Admit: 2020-12-06 | Discharge: 2020-12-06 | Disposition: A | Discharge status: Home / Self Care | Payer: Medicare Other | Source: Ambulatory Visit | Attending: Radiation Oncology | Admitting: Radiation Oncology

## 2020-12-06 DIAGNOSIS — D0511 Intraductal carcinoma in situ of right breast: Secondary | ICD-10-CM | POA: Diagnosis not present

## 2020-12-07 ENCOUNTER — Ambulatory Visit
Admission: RE | Admit: 2020-12-07 | Discharge: 2020-12-07 | Disposition: A | Discharge status: Home / Self Care | Payer: Medicare Other | Source: Ambulatory Visit | Attending: Radiation Oncology | Admitting: Radiation Oncology

## 2020-12-07 ENCOUNTER — Other Ambulatory Visit: Payer: Self-pay

## 2020-12-07 DIAGNOSIS — D0511 Intraductal carcinoma in situ of right breast: Secondary | ICD-10-CM | POA: Diagnosis not present

## 2020-12-08 ENCOUNTER — Ambulatory Visit
Admission: RE | Admit: 2020-12-08 | Discharge: 2020-12-08 | Disposition: A | Discharge status: Home / Self Care | Payer: Medicare Other | Source: Ambulatory Visit | Attending: Radiation Oncology | Admitting: Radiation Oncology

## 2020-12-08 DIAGNOSIS — D0511 Intraductal carcinoma in situ of right breast: Secondary | ICD-10-CM | POA: Diagnosis not present

## 2020-12-11 ENCOUNTER — Encounter: Payer: Self-pay | Admitting: Radiation Oncology

## 2020-12-11 ENCOUNTER — Encounter: Payer: Self-pay | Admitting: *Deleted

## 2020-12-11 ENCOUNTER — Other Ambulatory Visit: Payer: Self-pay

## 2020-12-11 ENCOUNTER — Ambulatory Visit
Admission: RE | Admit: 2020-12-11 | Discharge: 2020-12-11 | Disposition: A | Discharge status: Home / Self Care | Payer: Medicare Other | Source: Ambulatory Visit | Attending: Radiation Oncology | Admitting: Radiation Oncology

## 2020-12-11 DIAGNOSIS — D0511 Intraductal carcinoma in situ of right breast: Secondary | ICD-10-CM | POA: Diagnosis not present

## 2020-12-14 NOTE — Progress Notes (Signed)
  Radiation Oncology         [phone]) [phone] ________________________________  Name: Brittany Moreno MRN: [id number]  Date: 12/04/2020  DOB: [birth year]  SIMULATION NOTE   NARRATIVE:  The patient underwent simulation today for ongoing radiation therapy.  The existing CT study set was employed for the purpose of virtual treatment planning.  The target and avoidance structures were reviewed and modified as necessary.  Treatment planning then occurred.  The radiation boost prescription was entered and confirmed.  A total of 1 complex treatment devices were fabricated in the form of multi-leaf collimators to shape radiation around the targets while maximally excluding nearby normal structures. I have requested : Isodose Plan.    PLAN:  This modified radiation beam arrangement is intended to continue the current radiation dose to an additional 8 Gy in 4 fractions for a total cumulative dose of 50.56 Gy.    ------------------------------------------------  Radene Gunning, MD, PhD

## 2020-12-26 NOTE — Progress Notes (Signed)
                                                                                                                                                             Patient Name: Brittany Moreno MRN: [id number] DOB: January 24, 1956 Referring Physician: Courtney Paris Date of Service: 12/11/2020 [facility], [place]                                                        End Of Treatment Note  Diagnoses: D05.11-Intraductal carcinoma in situ of right breast  Cancer Staging:  Low grade, ER/PR positive DCIS of the right breast  Intent: Curative  Radiation Treatment Dates: 11/13/2020 through 12/11/2020 Site Technique Total Dose (Gy) Dose per Fx (Gy) Completed Fx Beam Energies  Breast, Right: Breast_Rt 3D 42.56/42.56 2.66 16/16 6XFFF  Breast, Right: Breast_Rt_Bst specialPort 8/8 2 4/4 9E, 12E   Narrative: The patient tolerated radiation therapy relatively well. She developed fatigue and anticipated skin changes in the treatment field.  Plan: The patient will receive a call in about one month from the radiation oncology department. She will continue follow up with Dr. Mosetta Putt as well.    ________________________________________________    Osker Mason, [facility]

## 2021-01-10 ENCOUNTER — Other Ambulatory Visit: Payer: Self-pay | Admitting: Adult Health

## 2021-01-10 DIAGNOSIS — D0511 Intraductal carcinoma in situ of right breast: Secondary | ICD-10-CM

## 2021-01-22 ENCOUNTER — Ambulatory Visit
Admission: RE | Admit: 2021-01-22 | Discharge: 2021-01-22 | Disposition: A | Discharge status: Home / Self Care | Payer: Medicare Other | Source: Ambulatory Visit | Attending: Radiation Oncology | Admitting: Radiation Oncology

## 2021-01-22 DIAGNOSIS — D0511 Intraductal carcinoma in situ of right breast: Secondary | ICD-10-CM

## 2021-01-22 NOTE — Progress Notes (Signed)
  Radiation Oncology         [phone] ________________________________  Name: Brittany Moreno MRN: [id number]  Date of Service: 01/22/2021  DOB: Aug 31, 1955  Post Treatment Telephone Note  Diagnosis:   Low grade, ER/PR positive DCIS of the right breast  Interval Since Last Radiation:  6 weeks    11/13/2020 through 12/11/2020 Site Technique Total Dose (Gy) Dose per Fx (Gy) Completed Fx Beam Energies  Breast, Right: Breast_Rt 3D 42.56/42.56 2.66 16/16 6XFFF  Breast, Right: Breast_Rt_Bst specialPort 8/8 2 4/4 9E, 12E   Narrative:  The patient was contacted today for routine follow-up. During treatment she did very well with radiotherapy and did not have significant desquamation. She reports she is doing well and her skin is healing nicely with mild retention of pigment. She's started her antiestrogen therapy and feels that this is going well so far.  Impression/Plan: 1. Low grade, ER/PR positive DCIS of the right breast. The patient has been doing well since completion of radiotherapy. We discussed that we would be happy to continue to follow her as needed, but she will also continue to follow up with Dr. Mosetta Putt in medical oncology. She was counseled on skin care as well as measures to avoid sun exposure to this area.  2. Survivorship. We discussed the importance of survivorship evaluation and encouraged her to attend her upcoming visit with that clinic.       Osker Mason, PAC

## 2021-03-01 ENCOUNTER — Telehealth: Payer: Self-pay | Admitting: *Deleted

## 2021-03-05 ENCOUNTER — Inpatient Hospital Stay: Payer: Medicare Other | Attending: Nurse Practitioner | Admitting: Nurse Practitioner

## 2021-03-05 ENCOUNTER — Other Ambulatory Visit: Payer: Self-pay

## 2021-03-05 ENCOUNTER — Encounter: Payer: Self-pay | Admitting: Nurse Practitioner

## 2021-03-05 VITALS — BP 126/80 | HR 83 | Temp 97.8°F | Resp 18 | Ht 65.0 in | Wt 159.7 lb

## 2021-03-05 DIAGNOSIS — Z170 Estrogen receptor positive status [ER+]: Secondary | ICD-10-CM | POA: Insufficient documentation

## 2021-03-05 DIAGNOSIS — M25561 Pain in right knee: Secondary | ICD-10-CM | POA: Diagnosis not present

## 2021-03-05 DIAGNOSIS — D0511 Intraductal carcinoma in situ of right breast: Secondary | ICD-10-CM | POA: Insufficient documentation

## 2021-03-05 DIAGNOSIS — M545 Low back pain, unspecified: Secondary | ICD-10-CM | POA: Diagnosis not present

## 2021-03-05 DIAGNOSIS — Z801 Family history of malignant neoplasm of trachea, bronchus and lung: Secondary | ICD-10-CM | POA: Insufficient documentation

## 2021-03-05 DIAGNOSIS — Z79810 Long term (current) use of selective estrogen receptor modulators (SERMs): Secondary | ICD-10-CM | POA: Diagnosis not present

## 2021-03-05 DIAGNOSIS — F17210 Nicotine dependence, cigarettes, uncomplicated: Secondary | ICD-10-CM | POA: Diagnosis not present

## 2021-03-05 DIAGNOSIS — Z8051 Family history of malignant neoplasm of kidney: Secondary | ICD-10-CM | POA: Insufficient documentation

## 2021-03-05 DIAGNOSIS — Z90710 Acquired absence of both cervix and uterus: Secondary | ICD-10-CM | POA: Diagnosis not present

## 2021-03-05 DIAGNOSIS — M25562 Pain in left knee: Secondary | ICD-10-CM | POA: Insufficient documentation

## 2021-03-05 DIAGNOSIS — Z803 Family history of malignant neoplasm of breast: Secondary | ICD-10-CM | POA: Diagnosis not present

## 2021-03-05 DIAGNOSIS — N951 Menopausal and female climacteric states: Secondary | ICD-10-CM | POA: Insufficient documentation

## 2021-03-05 DIAGNOSIS — Z808 Family history of malignant neoplasm of other organs or systems: Secondary | ICD-10-CM | POA: Insufficient documentation

## 2021-03-05 DIAGNOSIS — Z923 Personal history of irradiation: Secondary | ICD-10-CM | POA: Diagnosis not present

## 2021-03-05 MED ORDER — TAMOXIFEN CITRATE 20 MG PO TABS: 20.0000 mg | ORAL_TABLET | Freq: Every day | ORAL | 3 refills | Status: DC

## 2021-03-05 MED ORDER — VENLAFAXINE HCL ER 37.5 MG PO CP24: 37.5000 mg | ORAL_CAPSULE | Freq: Every day | ORAL | 0 refills | Status: DC

## 2021-03-05 NOTE — Progress Notes (Signed)
CLINIC:  Survivorship   Patient Care Team: Simona Huh, NP as PCP - General (Nurse Practitioner) Rockwell Germany, RN as Oncology Nurse Navigator Mauro Kaufmann, RN as Oncology Nurse Navigator Jovita Kussmaul, MD as Consulting Physician (General Surgery) [name], MD as Consulting Physician (Hematology) Kyung Rudd, MD as Consulting Physician (Radiation Oncology) Alla Feeling, NP as Nurse Practitioner (Nurse Practitioner)   REASON FOR VISIT:  Routine follow-up post-treatment for a recent history of breast cancer.  BRIEF ONCOLOGIC HISTORY:  Oncology History Overview Note  Cancer Staging Ductal carcinoma in situ (DCIS) of right breast Staging form: Breast, AJCC 8th Edition - Clinical stage from 07/26/2020: Stage 0 (cTis (DCIS), cN0, cM0, G1, ER+, PR+, HER2: Not Assessed) - Signed by [name], MD on 08/01/2020 Stage prefix: Initial diagnosis    Ductal carcinoma in situ (DCIS) of right breast  07/18/2020 Breast MRI   IMPRESSION: Indeterminate clumped non-mass enhancement involving the UPPER central RIGHT breast spanning 1.4 x 1.2 x 1.3  cm maximally.   07/26/2020 Cancer Staging   Staging form: Breast, AJCC 8th Edition - Clinical stage from 07/26/2020: Stage 0 (cTis (DCIS), cN0, cM0, G1, ER+, PR+, HER2: Not Assessed) - Signed by [name], MD on 08/01/2020 Stage prefix: Initial diagnosis   07/26/2020 Initial Biopsy   Diagnosis Breast, right, needle core biopsy, upper inner - DUCTAL CARCINOMA IN SITU INVOLVING COMPLEX SCLEROSING LESION WITH CALCIFICATIONS. Microscopic Comment The DCIS is of intermediate nuclear grade with focal necrosis. E-cadherin is positive and CK5/6 is negative in DCIS. P63, Calponin and SMM-1 demonstrate the presence of myoepithelium throughout. Ancillary studies will be reported separately. Results reported to [facility] on 07/27/2020. Dr. Tresa Moore reviewed the case.   07/26/2020 Receptors her2    Results: IMMUNOHISTOCHEMICAL AND  MORPHOMETRIC ANALYSIS PERFORMED MANUALLY Estrogen Receptor: 95%, POSITIVE, STRONG STAINING INTENSITY Progesterone Receptor: 15%, POSITIVE, STRONG STAINING INTENSITY   07/31/2020 Initial Diagnosis   Ductal carcinoma in situ (DCIS) of right breast   08/21/2020 Genetic Testing   Negative genetic testing:  No pathogenic variants detected on the [employer] CancerNext-Expanded + RNAinsight panel. The report date is 08/21/2020.   The CancerNext-Expanded + RNAinsight gene panel offered by [employer] and includes sequencing and rearrangement analysis for the following 77 genes: AIP, ALK, APC, ATM, AXIN2, BAP1, BARD1, BLM, BMPR1A, BRCA1, BRCA2, BRIP1, CDC73, CDH1, CDK4, CDKN1B, CDKN2A, CHEK2, CTNNA1, DICER1, FANCC, FH, FLCN, GALNT12, KIF1B, LZTR1, MAX, MEN1, MET, MLH1, MSH2, MSH3, MSH6, MUTYH, NBN, NF1, NF2, NTHL1, PALB2, PHOX2B, PMS2, POT1, PRKAR1A, PTCH1, PTEN, RAD51C, RAD51D, RB1, RECQL, RET, SDHA, SDHAF2, SDHB, SDHC, SDHD, SMAD4, SMARCA4, SMARCB1, SMARCE1, STK11, SUFU, TMEM127, TP53, TSC1, TSC2, VHL and XRCC2 (sequencing and deletion/duplication); EGFR, EGLN1, HOXB13, KIT, MITF, PDGFRA, POLD1 and POLE (sequencing only); EPCAM and GREM1 (deletion/duplication only). RNA data is routinely analyzed for use in variant interpretation for all genes.   09/29/2020 Surgery   RIGHT BREAST LUMPECTOMY WITH RADIOACTIVE SEED LOCALIZATION by Dr Marlou Starks   FINAL MICROSCOPIC DIAGNOSIS:   A. BREAST, RIGHT, LUMPECTOMY:  -  Ductal carcinoma in situ, low grade, arising in a complex sclerosing  lesion  -  Margins uninvolved by carcinoma (0.4; posterior margin)  -  Previous biopsy site changes  -  See oncology table and comment below    09/29/2020 Cancer Staging   Staging form: Breast, AJCC 8th Edition - Pathologic stage from 09/29/2020: Stage Unknown (pTis (DCIS), pNX, cM0, ER+, PR+) - Signed by Alla Feeling, NP on 03/04/2021 Nuclear grade: Bing Neighbors  11/13/2020 - 12/11/2020 Radiation Therapy   Adjuvant Radiation by Dr Lisbeth Renshaw     03/05/2021 Survivorship   SCP delivered by Cira Rue, NP     INTERVAL HISTORY:  Brittany Moreno presents to the [facility] today for our initial meeting to review her survivorship care plan detailing her treatment course for breast cancer, as well as monitoring long-term side effects of that treatment, education regarding health maintenance, screening, and overall wellness and health promotion.     Overall, [name] reports feeling well since completing her radiation therapy approximately 3 months ago.  The right breast is still a bit "tan" and tender but improving.  She began tamoxifen, tolerating with moderate hot flashes and mood swings.  She does have a lot going on in her life.  She was prescribed sertraline for the "buzzing in my head" at one time but never took it, she was urged not to take this while on tamoxifen.  She has joint pain in her low back and knees which is increased on tamoxifen but she still functions well, not on pain medication.  Denies bleeding, signs of thrombosis, recent infection, or any other complaints.  ONCOLOGY TREATMENT TEAM:  1. Surgeon:  Dr. Marlou Starks at [facility] 2. Medical Oncologist: Dr. Burr Medico 3. Radiation Oncologist: Dr. Lisbeth Renshaw    PAST MEDICAL/SURGICAL HISTORY:  Past Medical History:  Diagnosis Date   Family history of basal cell carcinoma    Family history of breast cancer    Family history of kidney cancer    Family history of lung cancer    Family history of thyroid cancer    High cholesterol    History of basal cell carcinoma    Hypertension    Past Surgical History:  Procedure Laterality Date   ABDOMINAL HYSTERECTOMY     BREAST LUMPECTOMY WITH RADIOACTIVE SEED LOCALIZATION Right 09/29/2020   Procedure: RIGHT BREAST LUMPECTOMY WITH RADIOACTIVE SEED LOCALIZATION;  Surgeon: Jovita Kussmaul, MD;  Location: [facility];  Service: General;  Laterality: Right;   [place]     TUBAL LIGATION       ALLERGIES:  Allergies  Allergen Reactions   Bactrim [Sulfamethoxazole-Trimethoprim] Shortness Of Breath and Rash   Losartan Other (See Comments) and Shortness Of Breath    Difficult breathing    Erythromycin    Penicillins    Prednisone    Percocet [Oxycodone-Acetaminophen] Anxiety    Hallucinations     CURRENT MEDICATIONS:  Outpatient Encounter Medications as of 03/05/2021  Medication Sig   aspirin EC 81 MG tablet Take 325 mg by mouth daily.   lisinopril-hydrochlorothiazide (ZESTORETIC) 20-12.5 MG tablet Take 1 tablet by mouth daily.   tamoxifen (NOLVADEX) 20 MG tablet Take 1 tablet (20 mg total) by mouth daily.   venlafaxine XR (EFFEXOR-XR) 37.5 MG 24 hr capsule Take 1 capsule (37.5 mg total) by mouth daily with breakfast.   [DISCONTINUED] guaiFENesin-codeine 100-10 MG/5ML syrup Take 10 mLs by mouth every 6 (six) hours as needed. (Patient not taking: Reported on 10/31/2020)   [DISCONTINUED] ipratropium (ATROVENT) 0.03 % nasal spray Place 2 sprays into both nostrils 2 (two) times daily. (Patient not taking: Reported on 10/31/2020)   [DISCONTINUED] levocetirizine (XYZAL) 5 MG tablet Take 2.5 mg by mouth every evening.   [DISCONTINUED] tamoxifen (NOLVADEX) 20 MG tablet Take 1 tablet (20 mg total) by mouth daily.   [DISCONTINUED] traMADol (ULTRAM) 50 MG tablet Take 1-2  tablets (50-100 mg total) by mouth every 6 (six) hours as needed. (Patient not taking: Reported on 10/31/2020)   No facility-administered encounter medications on file as of 03/05/2021.     ONCOLOGIC FAMILY HISTORY:  Family History  Problem Relation Age of Onset   Hypertension Mother    Diabetes Mother    Hypertension Father    Aneurysm Father    Hypertension Sister    Diabetes Sister    Breast cancer Sister 58   Kidney cancer Sister        dx <61, unilateral   Crohn's disease Sister    Anuerysm Sister    Breast cancer Maternal Aunt        unknown age   Thyroid cancer  Maternal Aunt        unknown age   Lung cancer Maternal Uncle        dx >50, worked in Architect, smoker   Basal cell carcinoma Son 25       multiple (back, face, shoulder, leg), +sun exposure     GENETIC COUNSELING/TESTING: Yes, negative  SOCIAL HISTORY:  ONALEE STEINBACH is a current cigarette smoker, she has been unable to quit.  She declined referral to smoking cessation program.   PHYSICAL EXAMINATION:  Vital Signs:   Vitals:   03/05/21 1235  BP: 126/80  Pulse: 83  Resp: 18  Temp: 97.8 F (36.6 C)  SpO2: 100%   Filed Weights   03/05/21 1235  Weight: 159 lb 11.2 oz (72.4 kg)   General: Well-appearing female in no acute distress.   HEENT: Sclerae anicteric Lymph: No cervical, supraclavicular, or infraclavicular lymphadenopathy noted on palpation.  Respiratory:  breathing non-labored.  Neuro: No focal deficits. Steady gait.  Psych: Mood and affect normal and appropriate for situation.  Extremities: No edema. MSK: No focal spinal tenderness to palpation.  Full range of motion in bilateral upper extremities Skin: Warm and dry. Breast exam: Breasts are symmetric without nipple discharge or inversion.  S/p right lumpectomy and radiation, incisions completely healed with mild scar tissue.  There is mild diffuse hyperpigmentation to the right breast.  There is lumpy breast tissue bilaterally with no discrete mass or nodularity in either breast or axilla that I could appreciate  LABORATORY DATA:  None for this visit.  DIAGNOSTIC IMAGING:  None for this visit.      ASSESSMENT AND PLAN:  Ms.. Moreno is a pleasant 65 y.o. female with Stage 0 right breast DCIS, ER+/PR+, diagnosed in 07/2020, treated with lumpectomy, adjuvant radiation therapy, and anti-estrogen therapy with tamoxifen beginning in August.  She presents to the [facility] for our initial meeting and routine follow-up post-completion of treatment for breast cancer.    1. Stage 0 right breast DCIS:   Ms. Slape is continuing to recover from definitive treatment for breast cancer. She will follow-up with her medical oncologist, Dr. Burr Medico in 06/2021 with history and physical exam per surveillance protocol.  She will continue her anti-estrogen therapy with tamoxifen. Thus far, she is tolerating moderately well, with side effects including hot flash, mood fluctuation, and joint pain. She was instructed to make Dr. Burr Medico or myself aware if she begins to experience any worsening side effects of the medication and I could see her back in clinic to help manage those side effects, as needed. Though the incidence is low, there is an associated risk of endometrial cancer with anti-estrogen therapies like Tamoxifen.  Ms. Ton is s/p hysterectomy. Other side effects of Tamoxifen were again reviewed  with her as well. Today, a comprehensive survivorship care plan and treatment summary was reviewed with the patient today detailing her breast cancer diagnosis, treatment course, potential late/long-term effects of treatment, appropriate follow-up care with recommendations for the future, and patient education resources.  A copy of this summary, along with a letter will be sent to the patient's primary care provider via [employer] message after today's visit.    2.  Hot flash, mood swing, joint pain (knees, low back): She had baseline pain in her low back and knees, functioning well.  Pain slightly worse on tamoxifen but tolerable.  Continue monitoring.  She has moderate hot flashes and mood swings on tamoxifen.  She was prescribed sertraline at one time but never took it.  We will try Effexor for vasomotor symptoms and mood stabilization.  Side effects were reviewed, she agrees to try.  She knows to let us know if she does not tolerate this medication.  3. Bone health:  Given Ms. Kimpel's age/history of breast cancer and smoking, she should be monitored for bone demineralization.  DEXA is scheduled on 04/19/2021 with mammogram.   In the meantime, she was encouraged to take daily calcium, vitamin D, as well as increase her weight-bearing activities.  She was given education on specific activities to promote bone health.  4. Cancer screening:  Due to Ms. Osentoski's history and her age, she should receive screening for skin cancers, colon cancer, and gynecologic cancers.  She is s/p partial hysterectomy, unsure if she still has a cervix but has not had a Pap smear in 10 years.  She will follow-up with PCP on this at her next visit in October.  The information and recommendations are listed on the patient's comprehensive care plan/treatment summary and were reviewed in detail with the patient.    5. Health maintenance and wellness promotion: Ms. Lanting was encouraged to consume 5-7 servings of fruits and vegetables per day. We reviewed the "Nutrition Rainbow" handout.  She was also encouraged to engage in moderate to vigorous exercise for 30 minutes per day most days of the week. She was instructed to limit her alcohol consumption and was encouraged stop smoking.  She has tried to quit unsuccessfully many times.  She declined referral to smoking cessation program.  We reviewed that smoking and alcohol increase her risk of breast cancer recurrence and the development of new cancers, he understands.   6. Support services/counseling: It is not uncommon for this period of the patient's cancer care trajectory to be one of many emotions and stressors.  We discussed an opportunity for her to participate in the next session of [facility] ("Finding Your New Normal") support group series designed for patients after they have completed treatment.   Ms. Donaway was encouraged to take advantage of our many other support services programs, support groups, and/or counseling in coping with her new life as a cancer survivor after completing anti-cancer treatment.  She was offered support today through active listening and expressive supportive counseling.  She was  given information regarding our available services and encouraged to contact me with any questions or for help enrolling in any of our support group/programs.    Dispo:   -Mammogram and DEXA 04/19/2021  -Follow-up with [facility] surgeon Dr. Marlou Starks in 04/2021  -return to cancer center as scheduled in January 2023 -Due to her extremely dense breast tissue and high risk (Tyrer-Cuzick), plan to proceed with screening breast MRI in May 2023 for close surveillance -She is welcome to  return back to the Survivorship Clinic at any time; no additional follow-up needed at this time.  -Consider referral back to survivorship as a long-term survivor for continued surveillance  A total of (30) minutes of face-to-face time was spent with this patient with greater than 50% of that time in counseling and care-coordination.   Cira Rue, NP Survivorship Program [facility] [phone]   Note: PRIMARY CARE PROVIDER Simona Huh, [place] [phone]

## 2021-03-19 ENCOUNTER — Encounter: Payer: Self-pay | Admitting: Nurse Practitioner

## 2021-03-21 ENCOUNTER — Other Ambulatory Visit: Payer: Self-pay | Admitting: Nurse Practitioner

## 2021-04-19 ENCOUNTER — Encounter: Payer: Self-pay | Admitting: Hematology

## 2021-06-28 ENCOUNTER — Other Ambulatory Visit: Payer: Self-pay

## 2021-06-28 DIAGNOSIS — D0511 Intraductal carcinoma in situ of right breast: Secondary | ICD-10-CM

## 2021-06-29 ENCOUNTER — Encounter: Payer: Self-pay | Admitting: Hematology

## 2021-06-29 ENCOUNTER — Inpatient Hospital Stay: Payer: Medicare Other | Attending: Hematology | Admitting: Hematology

## 2021-06-29 ENCOUNTER — Other Ambulatory Visit: Payer: Self-pay

## 2021-06-29 ENCOUNTER — Inpatient Hospital Stay: Payer: Medicare Other

## 2021-06-29 VITALS — BP 122/75 | HR 86 | Temp 98.0°F | Resp 17 | Wt 157.3 lb

## 2021-06-29 DIAGNOSIS — I10 Essential (primary) hypertension: Secondary | ICD-10-CM | POA: Insufficient documentation

## 2021-06-29 DIAGNOSIS — Z7982 Long term (current) use of aspirin: Secondary | ICD-10-CM | POA: Insufficient documentation

## 2021-06-29 DIAGNOSIS — D0511 Intraductal carcinoma in situ of right breast: Secondary | ICD-10-CM | POA: Diagnosis present

## 2021-06-29 DIAGNOSIS — Z79810 Long term (current) use of selective estrogen receptor modulators (SERMs): Secondary | ICD-10-CM | POA: Diagnosis not present

## 2021-06-29 DIAGNOSIS — F17210 Nicotine dependence, cigarettes, uncomplicated: Secondary | ICD-10-CM | POA: Diagnosis not present

## 2021-06-29 DIAGNOSIS — E7800 Pure hypercholesterolemia, unspecified: Secondary | ICD-10-CM | POA: Diagnosis not present

## 2021-06-29 DIAGNOSIS — Z79899 Other long term (current) drug therapy: Secondary | ICD-10-CM | POA: Insufficient documentation

## 2021-06-29 DIAGNOSIS — M8580 Other specified disorders of bone density and structure, unspecified site: Secondary | ICD-10-CM | POA: Insufficient documentation

## 2021-06-29 LAB — CBC WITH DIFFERENTIAL (CANCER CENTER ONLY)
Abs Immature Granulocytes: 0.02 10*3/uL (ref 0.00–0.07)
Basophils Absolute: 0 10*3/uL (ref 0.0–0.1)
Basophils Relative: 1 %
Eosinophils Absolute: 0.3 10*3/uL (ref 0.0–0.5)
Eosinophils Relative: 4 %
HCT: 39.5 % (ref 36.0–46.0)
Hemoglobin: 13.2 g/dL (ref 12.0–15.0)
Immature Granulocytes: 0 %
Lymphocytes Relative: 30 %
Lymphs Abs: 2.1 10*3/uL (ref 0.7–4.0)
MCH: 32.8 pg (ref 26.0–34.0)
MCHC: 33.4 g/dL (ref 30.0–36.0)
MCV: 98.3 fL (ref 80.0–100.0)
Monocytes Absolute: 0.5 10*3/uL (ref 0.1–1.0)
Monocytes Relative: 7 %
Neutro Abs: 4 10*3/uL (ref 1.7–7.7)
Neutrophils Relative %: 58 %
Platelet Count: 243 10*3/uL (ref 150–400)
RBC: 4.02 MIL/uL (ref 3.87–5.11)
RDW: 12.4 % (ref 11.5–15.5)
WBC Count: 7 10*3/uL (ref 4.0–10.5)
nRBC: 0 % (ref 0.0–0.2)

## 2021-06-29 LAB — CMP (CANCER CENTER ONLY)
ALT: 41 U/L (ref 0–44)
AST: 27 U/L (ref 15–41)
Albumin: 4.2 g/dL (ref 3.5–5.0)
Alkaline Phosphatase: 43 U/L (ref 38–126)
Anion gap: 6 (ref 5–15)
BUN: 15 mg/dL (ref 8–23)
CO2: 30 mmol/L (ref 22–32)
Calcium: 9.5 mg/dL (ref 8.9–10.3)
Chloride: 105 mmol/L (ref 98–111)
Creatinine: 0.89 mg/dL (ref 0.44–1.00)
GFR, Estimated: >60 mL/min (ref >60)
Glucose, Bld: 99 mg/dL (ref 70–99)
Potassium: 4.7 mmol/L (ref 3.5–5.1)
Sodium: 141 mmol/L (ref 135–145)
Total Bilirubin: 0.3 mg/dL (ref 0.3–1.2)
Total Protein: 7 g/dL (ref 6.5–8.1)

## 2021-06-29 NOTE — Progress Notes (Addendum)
[place]   Telephone:([phone] Fax:([phone]   Clinic Follow up Note   Patient Care Team: Simona Huh, NP as PCP - General (Nurse Practitioner) Rockwell Germany, RN as Oncology Nurse Navigator Mauro Kaufmann, RN as Oncology Nurse Navigator Jovita Kussmaul, MD as Consulting Physician (General Surgery) [name], MD as Consulting Physician (Hematology) Kyung Rudd, MD as Consulting Physician (Radiation Oncology) Alla Feeling, NP as Nurse Practitioner (Nurse Practitioner)  Date of Service:  06/29/2021  CHIEF COMPLAINT: f/u of right breast DCIS  CURRENT THERAPY:  Tamoxifen, started 01/2021  ASSESSMENT & PLAN:  Brittany Moreno is a 66 y.o. female with   1. Right breast DCIS, low grade,  ER+/PR+ -found on screening mammogram. Biopsy 07/26/20 confirmed low grade DCIS involving complex sclerosing lesion. -right lumpectomy on 09/29/20 under Dr. Marlou Starks showed 1.5 cm DCIS, low grade, negative margins. -she received adjuvant radiation under Dr. Lisbeth Renshaw 6/13-7/11/22. -she began tamoxifen in 01/2021.  She is tolerating moderate well except hot flashes.  I offered him gabapentin for hot flashes, she declined. --most recent mammogram on 04/19/21 was benign. -Breasts exam was benign today.  Due to her high risk for future breast cancer, and the density of breasts, I recommend annual breast MRI in addition to mammogram.  She is agreeable.  2. Bone Health -baseline DEXA performed 04/19/21 showed osteopenia (T-score -1.2 at [facility]) -She does not tolerate calcium and vitamin D.  I encouraged her to continue weightbearing exercise -Tamoxifen will likely improve her bone density.  3. 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 I discussed reducing use or stopping completely as this can lead to liver complications with long term use. -She smokes 1ppd for the past 50 years. She has been counseled on smoking cessation before and  tried to quit using multiple methods without success.     PLAN:  -continue tamoxifen -lab and f/u with NP Lacie in 6 months -Breast MRI in May 2023 echograms for image.   No problem-specific Assessment & Plan notes found for this encounter.   SUMMARY OF ONCOLOGIC HISTORY: Oncology History Overview Note  Cancer Staging Ductal carcinoma in situ (DCIS) of right breast Staging form: Breast, AJCC 8th Edition - Clinical stage from 07/26/2020: Stage 0 (cTis (DCIS), cN0, cM0, G1, ER+, PR+, HER2: Not Assessed) - Signed by [name], MD on 08/01/2020 Stage prefix: Initial diagnosis    Ductal carcinoma in situ (DCIS) of right breast  07/18/2020 Breast MRI   IMPRESSION: Indeterminate clumped non-mass enhancement involving the UPPER central RIGHT breast spanning 1.4 x 1.2 x 1.3  cm maximally.   07/26/2020 Cancer Staging   Staging form: Breast, AJCC 8th Edition - Clinical stage from 07/26/2020: Stage 0 (cTis (DCIS), cN0, cM0, G1, ER+, PR+, HER2: Not Assessed) - Signed by [name], MD on 08/01/2020 Stage prefix: Initial diagnosis    07/26/2020 Initial Biopsy   Diagnosis Breast, right, needle core biopsy, upper inner - DUCTAL CARCINOMA IN SITU INVOLVING COMPLEX SCLEROSING LESION WITH CALCIFICATIONS. Microscopic Comment The DCIS is of intermediate nuclear grade with focal necrosis. E-cadherin is positive and CK5/6 is negative in DCIS. P63, Calponin and SMM-1 demonstrate the presence of myoepithelium throughout. Ancillary studies will be reported separately. Results reported to [facility] on 07/27/2020. Dr. Tresa Moore reviewed the case.   07/26/2020 Receptors her2    Results: IMMUNOHISTOCHEMICAL AND MORPHOMETRIC ANALYSIS PERFORMED MANUALLY Estrogen Receptor: 95%, POSITIVE, STRONG STAINING INTENSITY  Progesterone Receptor: 15%, POSITIVE, STRONG STAINING INTENSITY   07/31/2020 Initial Diagnosis   Ductal carcinoma in situ (DCIS) of right breast   08/21/2020 Genetic Testing    Negative genetic testing:  No pathogenic variants detected on the [employer] CancerNext-Expanded + RNAinsight panel. The report date is 08/21/2020.   The CancerNext-Expanded + RNAinsight gene panel offered by [employer] and includes sequencing and rearrangement analysis for the following 77 genes: AIP, ALK, APC, ATM, AXIN2, BAP1, BARD1, BLM, BMPR1A, BRCA1, BRCA2, BRIP1, CDC73, CDH1, CDK4, CDKN1B, CDKN2A, CHEK2, CTNNA1, DICER1, FANCC, FH, FLCN, GALNT12, KIF1B, LZTR1, MAX, MEN1, MET, MLH1, MSH2, MSH3, MSH6, MUTYH, NBN, NF1, NF2, NTHL1, PALB2, PHOX2B, PMS2, POT1, PRKAR1A, PTCH1, PTEN, RAD51C, RAD51D, RB1, RECQL, RET, SDHA, SDHAF2, SDHB, SDHC, SDHD, SMAD4, SMARCA4, SMARCB1, SMARCE1, STK11, SUFU, TMEM127, TP53, TSC1, TSC2, VHL and XRCC2 (sequencing and deletion/duplication); EGFR, EGLN1, HOXB13, KIT, MITF, PDGFRA, POLD1 and POLE (sequencing only); EPCAM and GREM1 (deletion/duplication only). RNA data is routinely analyzed for use in variant interpretation for all genes.   09/29/2020 Surgery   RIGHT BREAST LUMPECTOMY WITH RADIOACTIVE SEED LOCALIZATION by Dr Marlou Starks   FINAL MICROSCOPIC DIAGNOSIS:   A. BREAST, RIGHT, LUMPECTOMY:  -  Ductal carcinoma in situ, low grade, arising in a complex sclerosing  lesion  -  Margins uninvolved by carcinoma (0.4; posterior margin)  -  Previous biopsy site changes  -  See oncology table and comment below    09/29/2020 Cancer Staging   Staging form: Breast, AJCC 8th Edition - Pathologic stage from 09/29/2020: Stage Unknown (pTis (DCIS), pNX, cM0, ER+, PR+) - Signed by Alla Feeling, NP on 03/04/2021 Nuclear grade: G1    11/13/2020 - 12/11/2020 Radiation Therapy   Adjuvant Radiation by Dr Lisbeth Renshaw    03/05/2021 Survivorship   SCP delivered by Cira Rue, NP      INTERVAL HISTORY:  Brittany Moreno is here for a follow up of DCIS. She was last seen by me on 11/29/20. She presents to the clinic alone.  She has been tolerating Tamoxifen well, except mild hot flushes,  especially at night, she does wake up at night but is able to get enough sleep.  She denies any joint pain, or other side effects.  She is otherwise doing very well.    All other systems were reviewed with the patient and are negative.  MEDICAL HISTORY:  Past Medical History:  Diagnosis Date   Family history of basal cell carcinoma    Family history of breast cancer    Family history of kidney cancer    Family history of lung cancer    Family history of thyroid cancer    High cholesterol    History of basal cell carcinoma    Hypertension     SURGICAL HISTORY: Past Surgical History:  Procedure Laterality Date   ABDOMINAL HYSTERECTOMY     BREAST LUMPECTOMY WITH RADIOACTIVE SEED LOCALIZATION Right 09/29/2020   Procedure: RIGHT BREAST LUMPECTOMY WITH RADIOACTIVE SEED LOCALIZATION;  Surgeon: Jovita Kussmaul, MD;  Location: [facility];  Service: General;  Laterality: Right;   [place]      I have reviewed the social history and family history with the patient and they are unchanged from previous note.  ALLERGIES:  is allergic to bactrim [sulfamethoxazole-trimethoprim], losartan, erythromycin, penicillins, prednisone, and percocet [oxycodone-acetaminophen].  MEDICATIONS:  Current Outpatient Medications  Medication Sig Dispense Refill  aspirin EC 81 MG tablet Take 325 mg by mouth daily.     lisinopril-hydrochlorothiazide (ZESTORETIC) 20-12.5 MG tablet Take 1 tablet by mouth daily.     tamoxifen (NOLVADEX) 20 MG tablet Take 1 tablet (20 mg total) by mouth daily. 90 tablet 3   No current facility-administered medications for this visit.    PHYSICAL EXAMINATION: ECOG PERFORMANCE STATUS: 0 - Asymptomatic  Vitals:   06/29/21 1522  BP: 122/75  Pulse: 86  Resp: 17  Temp: 98 F (36.7 C)  SpO2: 99%   Wt Readings from Last 3 Encounters:  06/29/21 157 lb 5 oz (71.4 kg)  03/05/21 159 lb  11.2 oz (72.4 kg)  11/29/20 155 lb 4.8 oz (70.4 kg)     GENERAL:alert, no distress and comfortable SKIN: skin color, texture, turgor are normal, no rashes or significant lesions EYES: normal, Conjunctiva are pink and non-injected, sclera clear NECK: supple, thyroid normal size, non-tender, without nodularity LYMPH:  no palpable lymphadenopathy in the cervical, axillary  LUNGS: clear to auscultation and percussion with normal breathing effort HEART: regular rate & rhythm and no murmurs and no lower extremity edema ABDOMEN:abdomen soft, non-tender and normal bowel sounds Musculoskeletal:no cyanosis of digits and no clubbing  NEURO: alert & oriented x 3 with fluent speech, no focal motor/sensory deficits BREAST: Right breast surgical incision has mild scar tissue.  No palpable mass, nodules or adenopathy bilaterally. Breast exam benign.   LABORATORY DATA:  I have reviewed the data as listed CBC Latest Ref Rng & Units 06/29/2021 08/02/2020 01/04/2019  WBC 4.0 - 10.5 K/uL 7.0 6.6 7.2  Hemoglobin 12.0 - 15.0 g/dL 13.2 15.8(H) 14.4  Hematocrit 36.0 - 46.0 % 39.5 47.4(H) 43.5  Platelets 150 - 400 K/uL 243 275 289     CMP Latest Ref Rng & Units 06/29/2021 09/26/2020 08/28/2020  Glucose 70 - 99 mg/dL 99 114(H) 100(H)  BUN 8 - 23 mg/dL 15 7(L) 5(L)  Creatinine 0.44 - 1.00 mg/dL 0.89 0.68 0.65  Sodium 135 - 145 mmol/L 141 131(L) 133(L)  Potassium 3.5 - 5.1 mmol/L 4.7 4.1 4.8  Chloride 98 - 111 mmol/L 105 97(L) 98  CO2 22 - 32 mmol/L '30 24 26  ' Calcium 8.9 - 10.3 mg/dL 9.5 8.8(L) 9.4  Total Protein 6.5 - 8.1 g/dL 7.0 - -  Total Bilirubin 0.3 - 1.2 mg/dL 0.3 - -  Alkaline Phos 38 - 126 U/L 43 - -  AST 15 - 41 U/L 27 - -  ALT 0 - 44 U/L 41 - -      RADIOGRAPHIC STUDIES: I have personally reviewed the radiological images as listed and agreed with the findings in the report. No results found.    Orders Placed This Encounter  Procedures   MR BREAST BILATERAL W WO CONTRAST INC CAD    Standing  Status:   Future    Standing Expiration Date:   06/29/2022    Order Specific Question:   If indicated for the ordered procedure, I authorize the administration of contrast media per Radiology protocol    Answer:   Yes    Order Specific Question:   What is the patient's sedation requirement?    Answer:   No Sedation    Order Specific Question:   Does the patient have a pacemaker or implanted devices?    Answer:   No    Order Specific Question:   Radiology Contrast Protocol - do NOT remove file path    Answer:   [email or web address]  Order Specific Question:   Preferred imaging location?    Answer:   [facility] (table limit-550lbs)   All questions were answered. The patient knows to call the clinic with any problems, questions or concerns. No barriers to learning was detected. The total time spent in the appointment was 30 minutes.     [name], MD 06/29/2021   I, Wilburn Mylar, am acting as scribe for [name], MD.   I have reviewed the above documentation for accuracy and completeness, and I agree with the above.

## 2021-09-05 ENCOUNTER — Encounter (HOSPITAL_COMMUNITY): Payer: Self-pay

## 2021-09-18 ENCOUNTER — Ambulatory Visit
Admission: RE | Admit: 2021-09-18 | Discharge: 2021-09-18 | Disposition: A | Discharge status: Home / Self Care | Payer: Medicare Other | Source: Ambulatory Visit | Attending: Hematology | Admitting: Hematology

## 2021-09-18 DIAGNOSIS — D0511 Intraductal carcinoma in situ of right breast: Secondary | ICD-10-CM

## 2021-09-18 MED ORDER — GADOBUTROL 1 MMOL/ML IV SOLN
8.0000 mL | Freq: Once | INTRAVENOUS | Status: AC | PRN
  Administered 2021-09-18: 8 mL via INTRAVENOUS

## 2021-09-20 ENCOUNTER — Other Ambulatory Visit: Payer: Medicare Other

## 2021-10-02 ENCOUNTER — Telehealth: Payer: Self-pay | Admitting: Hematology

## 2021-10-02 NOTE — Telephone Encounter (Signed)
I called patient and reviewed her recent breast MRI results with her in detail.  The MRI showed no evidence of new or recurrent disease.  Patient was concerned about the diagnosis of DCIS, I explained to her that she has history of DCIS but that was cured by surgery, and we use the diagnosis of DCIS to justify the screening MRI.  She voiced good understanding, and appreciated the call. ? ?Malachy Mood  ?10/02/2021  ?

## 2021-12-25 NOTE — Progress Notes (Unsigned)
[place]   Telephone:(336) [phone] Fax:(336) [phone]   Clinic Follow up Note   Patient Care Team: Brittany Huh, NP as PCP - General (Nurse Practitioner) Brittany Germany, RN as Oncology Nurse Navigator Brittany Kaufmann, RN as Oncology Nurse Navigator Brittany Kussmaul, MD as Consulting Physician (General Surgery) Brittany Merle, MD as Consulting Physician (Hematology) Brittany Rudd, MD as Consulting Physician (Radiation Oncology) Brittany Feeling, NP as Nurse Practitioner (Nurse Practitioner) 12/26/2021  CHIEF COMPLAINT: Follow up right breast DCIS  SUMMARY OF ONCOLOGIC HISTORY: Oncology History Overview Note  Cancer Staging Ductal carcinoma in situ (DCIS) of right breast Staging form: Breast, AJCC 8th Edition - Clinical stage from 07/26/2020: Stage 0 (cTis (DCIS), cN0, cM0, G1, ER+, PR+, HER2: Not Assessed) - Signed by Brittany Merle, MD on 08/01/2020 Stage prefix: Initial diagnosis    Ductal carcinoma in situ (DCIS) of right breast  07/18/2020 Breast MRI   IMPRESSION: Indeterminate clumped non-mass enhancement involving the UPPER central RIGHT breast spanning 1.4 x 1.2 x 1.3  cm maximally.   07/26/2020 Cancer Staging   Staging form: Breast, AJCC 8th Edition - Clinical stage from 07/26/2020: Stage 0 (cTis (DCIS), cN0, cM0, G1, ER+, PR+, HER2: Not Assessed) - Signed by Brittany Merle, MD on 08/01/2020 Stage prefix: Initial diagnosis   07/26/2020 Initial Biopsy   Diagnosis Breast, right, needle core biopsy, upper inner - DUCTAL CARCINOMA IN SITU INVOLVING COMPLEX SCLEROSING LESION WITH CALCIFICATIONS. Microscopic Comment The DCIS is of intermediate nuclear grade with focal necrosis. E-cadherin is positive and CK5/6 is negative in DCIS. P63, Calponin and SMM-1 demonstrate the presence of myoepithelium throughout. Ancillary studies will be reported separately. Results reported to [facility] on 07/27/2020. Dr. Tresa Moore reviewed the case.   07/26/2020 Receptors her2     Results: IMMUNOHISTOCHEMICAL AND MORPHOMETRIC ANALYSIS PERFORMED MANUALLY Estrogen Receptor: 95%, POSITIVE, STRONG STAINING INTENSITY Progesterone Receptor: 15%, POSITIVE, STRONG STAINING INTENSITY   07/31/2020 Initial Diagnosis   Ductal carcinoma in situ (DCIS) of right breast   08/21/2020 Genetic Testing   Negative genetic testing:  No pathogenic variants detected on the [employer] CancerNext-Expanded + RNAinsight panel. The report date is 08/21/2020.   The CancerNext-Expanded + RNAinsight gene panel offered by [employer] and includes sequencing and rearrangement analysis for the following 77 genes: AIP, ALK, APC, ATM, AXIN2, BAP1, BARD1, BLM, BMPR1A, BRCA1, BRCA2, BRIP1, CDC73, CDH1, CDK4, CDKN1B, CDKN2A, CHEK2, CTNNA1, DICER1, FANCC, FH, FLCN, GALNT12, KIF1B, LZTR1, MAX, MEN1, MET, MLH1, MSH2, MSH3, MSH6, MUTYH, NBN, NF1, NF2, NTHL1, PALB2, PHOX2B, PMS2, POT1, PRKAR1A, PTCH1, PTEN, RAD51C, RAD51D, RB1, RECQL, RET, SDHA, SDHAF2, SDHB, SDHC, SDHD, SMAD4, SMARCA4, SMARCB1, SMARCE1, STK11, SUFU, TMEM127, TP53, TSC1, TSC2, VHL and XRCC2 (sequencing and deletion/duplication); EGFR, EGLN1, HOXB13, KIT, MITF, PDGFRA, POLD1 and POLE (sequencing only); EPCAM and GREM1 (deletion/duplication only). RNA data is routinely analyzed for use in variant interpretation for all genes.   09/29/2020 Surgery   RIGHT BREAST LUMPECTOMY WITH RADIOACTIVE SEED LOCALIZATION by Dr Brittany Moreno   FINAL MICROSCOPIC DIAGNOSIS:   A. BREAST, RIGHT, LUMPECTOMY:  -  Ductal carcinoma in situ, low grade, arising in a complex sclerosing  lesion  -  Margins uninvolved by carcinoma (0.4; posterior margin)  -  Previous biopsy site changes  -  See oncology table and comment below    09/29/2020 Cancer Staging   Staging form: Breast, AJCC 8th Edition - Pathologic stage from 09/29/2020: Stage Unknown (pTis (DCIS), pNX, cM0, ER+, PR+) - Signed by Brittany Feeling, NP on 03/04/2021  Nuclear grade: G1   11/13/2020 - 12/11/2020 Radiation Therapy    Adjuvant Radiation by Dr Lisbeth Renshaw    03/05/2021 Survivorship   SCP delivered by Brittany Rue, NP     CURRENT THERAPY: Tamoxifen, started 01/2021  INTERVAL HISTORY: Brittany Moreno returns for follow up as scheduled.  Last seen by Dr. Burr Moreno 06/2021.  She has screening breast MRI 09/18/2021 which showed benign postlumpectomy and radiation changes in the right breast, no evidence of new or recurrent malignancy.  She has developed tremors, neurologists diagnosed essential tremor, 2 medications did not work now she is trying something for Parkinson's.  She continues tamoxifen, tolerating well with moderate hot flashes and night sweats which she can tolerate.  Her PCP encouraged her to try magnesium.  She is also able to tolerate a low-dose calcium and vitamin D tab now.  Denies bone or joint pain.  Denies concerns in her breast such as new lump/mass, nipple discharge or inversion, or skin change.  Denies vaginal bleeding.  She had a lung cancer screening CT which was reportedly negative.  She does not plan to quit smoking.  Denies any other new specific complaints.  All other systems were reviewed with the patient and are negative.  MEDICAL HISTORY:  Past Medical History:  Diagnosis Date   Family history of basal cell carcinoma    Family history of breast cancer    Family history of kidney cancer    Family history of lung cancer    Family history of thyroid cancer    High cholesterol    History of basal cell carcinoma    Hypertension     SURGICAL HISTORY: Past Surgical History:  Procedure Laterality Date   ABDOMINAL HYSTERECTOMY     BREAST LUMPECTOMY WITH RADIOACTIVE SEED LOCALIZATION Right 09/29/2020   Procedure: RIGHT BREAST LUMPECTOMY WITH RADIOACTIVE SEED LOCALIZATION;  Surgeon: Brittany Kussmaul, MD;  Location: [facility];  Service: General;  Laterality: Right;   [place]      I have reviewed the social  history and family history with the patient and they are unchanged from previous note.  ALLERGIES:  is allergic to bactrim [sulfamethoxazole-trimethoprim], losartan, erythromycin, penicillins, prednisone, and percocet [oxycodone-acetaminophen].  MEDICATIONS:  Current Outpatient Medications  Medication Sig Dispense Refill   aspirin EC 81 MG tablet Take 325 mg by mouth daily.     lisinopril-hydrochlorothiazide (ZESTORETIC) 20-12.5 MG tablet Take 1 tablet by mouth daily.     tamoxifen (NOLVADEX) 20 MG tablet Take 1 tablet (20 mg total) by mouth daily. 90 tablet 3   No current facility-administered medications for this visit.    PHYSICAL EXAMINATION: ECOG PERFORMANCE STATUS: 0 - Asymptomatic  Vitals:   12/26/21 0828  BP: 139/81  Pulse: 74  Resp: 15  Temp: 98.3 F (36.8 C)  SpO2: 98%   Filed Weights   12/26/21 0828  Weight: 160 lb (72.6 kg)    GENERAL:alert, no distress and comfortable SKIN: No rash.  Multiple Nevi and seborrheic keratoses EYES: sclera clear NECK: Without mass LYMPH:  no palpable cervical or supraclavicular lymphadenopathy  LUNGS:  normal breathing effort HEART:  no lower extremity edema ABDOMEN: abdomen soft, non-tender and normal bowel sounds Musculoskeletal: No focal spinal tenderness NEURO: alert & oriented x 3 with fluent speech, no focal motor/sensory deficits Breast exam: No bilateral nipple discharge or inversion.  S/p right lumpectomy and radiation, incisions  completely healed with minimal scar tissue.  Pain hyperpigmentation.  No palpable mass or nodularity in either breast or axilla that I could appreciate.  LABORATORY DATA:  I have reviewed the data as listed    Latest Ref Rng & Units 12/26/2021    8:00 AM 06/29/2021    3:00 PM 08/02/2020   12:18 PM  CBC  WBC 4.0 - 10.5 K/uL 7.3  7.0  6.6   Hemoglobin 12.0 - 15.0 g/dL 13.5  13.2  15.8   Hematocrit 36.0 - 46.0 % 39.2  39.5  47.4   Platelets 150 - 400 K/uL 223  243  275         Latest Ref  Rng & Units 12/26/2021    8:00 AM 06/29/2021    3:00 PM 09/26/2020    8:00 AM  CMP  Glucose 70 - 99 mg/dL 103  99  114   BUN 8 - 23 mg/dL '12  15  7   ' Creatinine 0.44 - 1.00 mg/dL 0.71  0.89  0.68   Sodium 135 - 145 mmol/L 139  141  131   Potassium 3.5 - 5.1 mmol/L 3.7  4.7  4.1   Chloride 98 - 111 mmol/L 105  105  97   CO2 22 - 32 mmol/L '28  30  24   ' Calcium 8.9 - 10.3 mg/dL 9.1  9.5  8.8   Total Protein 6.5 - 8.1 g/dL 7.1  7.0    Total Bilirubin 0.3 - 1.2 mg/dL 0.4  0.3    Alkaline Phos 38 - 126 U/L 37  43    AST 15 - 41 U/L 21  27    ALT 0 - 44 U/L 23  41        RADIOGRAPHIC STUDIES: I have personally reviewed the radiological images as listed and agreed with the findings in the report. No results found.   ASSESSMENT & PLAN: Brittany Moreno is a 67 y.o. female with    1. Right breast DCIS, low grade,  ER+/PR+ -Diagnosed 07/26/2020, biopsy confirmed low grade DCIS involving complex sclerosing lesion. -S/p right lumpectomy on 09/29/20 under Dr. Marlou Moreno showed 1.5 cm DCIS, low grade, negative margins. -S/p adjuvant radiation under Dr. Lisbeth Renshaw 6/13-7/11/22. -she began tamoxifen in 01/2021.  She is tolerating well except moderate hot flashes.  She plans to try oral magnesium. -most recent mammogram on 04/19/21 was benign. -Ms. Su Monks is clinically doing well.  Tolerating tamoxifen.  Breast exam is benign.  Labs are unremarkable.  We reviewed screening breast MRI from 09/2020 which was negative.  Overall no clinical concern for recurrent or new malignancy.   -Continue high risk screening program with staggered mammo/MRI.  Next mammogram 04/2022 -Continue surveillance, follow-up in 6 months or sooner if needed   2. Bone Health -baseline DEXA performed 04/19/21 showed osteopenia (T-score -1.2 at [facility]) -She is tolerating low-dose calcium/vitamin D.  Encouraged her to continue weightbearing exercise  -We reviewed the bone strengthening quality of tamoxifen    3. Alcohol and Smoking  cessation -She smokes 1ppd for the past 50 years. She has been counseled on smoking cessation before and tried to quit using multiple methods without success.  She does not plan to try to quit again -Counseled to limit alcohol, engage in healthy lifestyle, and continue age-appropriate health maintenance/cancer screenings -She reportedly had a negative lung cancer screening CT   Plan -Labs, MRI 4/23, and DEXA 11/22 reviewed -Continue breast cancer surveillance and tamoxifen -Follow-up in 6 months, or  sooner if needed   Orders Placed This Encounter  Procedures   MM DIAG BREAST TOMO BILATERAL    Standing Status:   Future    Standing Expiration Date:   12/27/2022    Scheduling Instructions:     [facility]    Order Specific Question:   Reason for Exam (SYMPTOM  OR DIAGNOSIS REQUIRED)    Answer:   History of right breast DCIS 07/2020 s/p lumpectomy and radiation    Order Specific Question:   Preferred imaging location?    Answer:   [facility]   All questions were answered. The patient knows to call the clinic with any problems, questions or concerns. No barriers to learning were detected.      Brittany Feeling, NP 12/26/21

## 2021-12-26 ENCOUNTER — Inpatient Hospital Stay: Payer: Medicare Other | Admitting: Nurse Practitioner

## 2021-12-26 ENCOUNTER — Inpatient Hospital Stay: Payer: Medicare Other | Attending: Nurse Practitioner

## 2021-12-26 ENCOUNTER — Encounter: Payer: Self-pay | Admitting: Nurse Practitioner

## 2021-12-26 ENCOUNTER — Other Ambulatory Visit: Payer: Self-pay

## 2021-12-26 VITALS — BP 139/81 | HR 74 | Temp 98.3°F | Resp 15 | Wt 160.0 lb

## 2021-12-26 DIAGNOSIS — F17210 Nicotine dependence, cigarettes, uncomplicated: Secondary | ICD-10-CM | POA: Diagnosis not present

## 2021-12-26 DIAGNOSIS — Z8051 Family history of malignant neoplasm of kidney: Secondary | ICD-10-CM | POA: Insufficient documentation

## 2021-12-26 DIAGNOSIS — Z801 Family history of malignant neoplasm of trachea, bronchus and lung: Secondary | ICD-10-CM | POA: Insufficient documentation

## 2021-12-26 DIAGNOSIS — D0511 Intraductal carcinoma in situ of right breast: Secondary | ICD-10-CM

## 2021-12-26 DIAGNOSIS — Z923 Personal history of irradiation: Secondary | ICD-10-CM | POA: Insufficient documentation

## 2021-12-26 DIAGNOSIS — Z86000 Personal history of in-situ neoplasm of breast: Secondary | ICD-10-CM | POA: Insufficient documentation

## 2021-12-26 DIAGNOSIS — Z803 Family history of malignant neoplasm of breast: Secondary | ICD-10-CM | POA: Diagnosis not present

## 2021-12-26 DIAGNOSIS — N951 Menopausal and female climacteric states: Secondary | ICD-10-CM | POA: Insufficient documentation

## 2021-12-26 DIAGNOSIS — Z808 Family history of malignant neoplasm of other organs or systems: Secondary | ICD-10-CM | POA: Insufficient documentation

## 2021-12-26 LAB — CBC WITH DIFFERENTIAL (CANCER CENTER ONLY)
Abs Immature Granulocytes: 0.03 10*3/uL (ref 0.00–0.07)
Basophils Absolute: 0.1 10*3/uL (ref 0.0–0.1)
Basophils Relative: 1 %
Eosinophils Absolute: 0.4 10*3/uL (ref 0.0–0.5)
Eosinophils Relative: 6 %
HCT: 39.2 % (ref 36.0–46.0)
Hemoglobin: 13.5 g/dL (ref 12.0–15.0)
Immature Granulocytes: 0 %
Lymphocytes Relative: 24 %
Lymphs Abs: 1.7 10*3/uL (ref 0.7–4.0)
MCH: 32.7 pg (ref 26.0–34.0)
MCHC: 34.4 g/dL (ref 30.0–36.0)
MCV: 94.9 fL (ref 80.0–100.0)
Monocytes Absolute: 0.5 10*3/uL (ref 0.1–1.0)
Monocytes Relative: 6 %
Neutro Abs: 4.6 10*3/uL (ref 1.7–7.7)
Neutrophils Relative %: 63 %
Platelet Count: 223 10*3/uL (ref 150–400)
RBC: 4.13 MIL/uL (ref 3.87–5.11)
RDW: 13.1 % (ref 11.5–15.5)
WBC Count: 7.3 10*3/uL (ref 4.0–10.5)
nRBC: 0 % (ref 0.0–0.2)

## 2021-12-26 LAB — CMP (CANCER CENTER ONLY)
ALT: 23 U/L (ref 0–44)
AST: 21 U/L (ref 15–41)
Albumin: 4.3 g/dL (ref 3.5–5.0)
Alkaline Phosphatase: 37 U/L — ABNORMAL LOW (ref 38–126)
Anion gap: 6 (ref 5–15)
BUN: 12 mg/dL (ref 8–23)
CO2: 28 mmol/L (ref 22–32)
Calcium: 9.1 mg/dL (ref 8.9–10.3)
Chloride: 105 mmol/L (ref 98–111)
Creatinine: 0.71 mg/dL (ref 0.44–1.00)
GFR, Estimated: >60 mL/min (ref >60)
Glucose, Bld: 103 mg/dL — ABNORMAL HIGH (ref 70–99)
Potassium: 3.7 mmol/L (ref 3.5–5.1)
Sodium: 139 mmol/L (ref 135–145)
Total Bilirubin: 0.4 mg/dL (ref 0.3–1.2)
Total Protein: 7.1 g/dL (ref 6.5–8.1)

## 2022-02-27 ENCOUNTER — Other Ambulatory Visit: Payer: Self-pay | Admitting: Nurse Practitioner

## 2022-03-14 ENCOUNTER — Encounter: Payer: Self-pay | Admitting: Nurse Practitioner

## 2022-03-15 ENCOUNTER — Encounter: Payer: Self-pay | Admitting: Neurology

## 2022-03-18 ENCOUNTER — Telehealth: Payer: Self-pay

## 2022-03-18 NOTE — Telephone Encounter (Signed)
LVM for Dr. Janace Litten with [facility] [phone]).  Left message on the Triage Nurse for Dr. Dianna Rossetti voicemail that Dr. Dianna Rossetti prescribed Prozac for the pt to take which is contraindicated with the pt's Tamoxifen.  Stated that Dr. Burr Medico would like for Dr. Dianna Rossetti to prescribe Effexor instead which does not have any drug interactions with the pt's Tamoxifen.  Asked for a return telephone call.  Awaiting a return call from Dr. Dianna Rossetti' office regarding medication change.  Also, notified pt of the situation.

## 2022-04-15 ENCOUNTER — Ambulatory Visit: Payer: Medicare Other | Admitting: Audiologist

## 2022-04-19 NOTE — Progress Notes (Unsigned)
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  Patient describes it well and states that she can feel a clear pull of the right hand when she goes to write, and that was very evident today.  She would have pull of the right wrist flexors, resulting in irregular tremor of the hand/arm.  Discussed with her that the best treatment for this was Botox.  We discussed risks and benefits and financial considerations.  Discussed with her that I no longer do limb Botox in my clinic.  She will be referred to Dr. Wynn Banker. Patient also has dystonic tremor of the head.  That is not bothersome to her.  Interestingly, she has a strong family history of this, and may have a genetic component as well.  She does not require treatment for this, since it is not bothersome.  She and I did discuss that Botox would be the treatment for this, if it ever becomes bothersome. 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  She actually tells me that that has been going on for many years and is not the reason that she came here.  She states that her thumb and right pointer finger have been moving for many years.  She cannot recall how long ago that started, just stating that her thumb is always sore from friction and movement, but again states that it is not bothersome.  I told her that the chorea actually is more bothersome to me than the dystonic tremor, but it is good news that it has not changed over the years and has not gone other places.  Given that it is unilateral, I do think we should look at an MRI of the brain with and without gadolinium.  She had an MRI of the brain in 2019, but she does not recall if she had the movement then, but does not think she did.  We will go ahead and start there.  We will do that with gadolinium given her history of cancer.  If that is negative, she really does not want to proceed further  with investigation of this given stability with time.  However, if anything changes or she gets more choreiform movements, I certainly would want to proceed with a further work-up.  Subjective:   Brittany Moreno was seen today in the movement disorders clinic for neurologic consultation at the request of Romie Levee, MD.  The consultation is for the evaluation of right hand tremor.  Patient has previously seen Dr. Allena Katz at this office.  This was almost 4 years ago for dizziness.  At that point in time, Dr. Allena Katz noted a nonfocal and nonlateralizing examination.  Specifically, she noted no abnormal movements.  Patient has since been seen by [facility] neurology.  She was first seen Oct 08, 2021 for right hand tremor.  Notes indicate that this was intention tremor and she was started on primidone 50 mg at bedtime.  Apparently, she felt that her blood pressure went too high with this medication (she states that she took it for a few days) and she was given propranolol (unclear dosage) but reported blood pressure went too low (she was also on lisinopril).  She followed back up in July, 2023 and it was still felt she likely had essential tremor, but was started on levodopa, 25/100 bid (she was taking it at 8am/6pm).  She took that for  2 months without SE and without benefit.    She returned to the office in early October and told the neurologist that she felt that her tremor worsened when she came of mood medicine a year ago so he started her on Prozac.  She states that prozac and tamoxifen interact so it was d/c and effexor was recommended but she had tried that previously without success.    She does tell me that she has had constant movement of the right thumb and finger for many years.  She does state that this is very different than the "tremor" for which she comes here.  She states that the movements of the right thumb and finger are not that bothersome.  She can feel that when she lays down at night.  She  states that the other movements are more bothersome.  She can feel what she describes as a pulling of the muscles of the arm on the flexor surface.  Tremor: Yes.     How long has it been going on? 3 years, but worse when she had radiation 2 years ago (for breast CA).  At rest or with activation?  Holding a cup/pen/peel potato/writing- never at rest  Fam hx of tremor?  Yes.  , sister has head tremor and maternal GM with head tremor  Located where?  R hand only.  She is R hand dominant  Affected by caffeine:  No.  Affected by alcohol:  No. (drinks 4 beers when plays cards and plays few times per month)  Affected by stress:  No. But notes head tremor then  Affected by fatigue:  No.  Spills soup if on spoon:  Yes.    Spills glass of liquid if full:  No.  Affects ADL's (tying shoes, brushing teeth, etc):  No.  Tremor inducing meds:  No.  Other Specific Symptoms:  Voice: no vocal tremor Postural symptoms:  No.  Falls?  No. Bradykinesia symptoms: no bradykinesia noted Loss of smell:  No. Loss of taste:  No. Urinary Incontinence:  has stress incontinence and wears pantyliner Difficulty Swallowing:  No. Handwriting, micrographia: No. N/V:  No. Lightheaded:  No.  Syncope: No. Diplopia:  No. Dyskinesia:  No.  Neuroimaging of the brain has previously been performed.    PREVIOUS MEDICATIONS: propranolol (low blood pressure -unclear dosage); primidone 50 mg (felt that blood pressure went too high); carbidopa/levodopa 25/100 bid  ALLERGIES:   Allergies  Allergen Reactions   Bactrim [Sulfamethoxazole-Trimethoprim] Shortness Of Breath and Rash   Losartan Other (See Comments) and Shortness Of Breath    Difficult breathing    Erythromycin    Penicillins    Prednisone    Percocet [Oxycodone-Acetaminophen] Anxiety    Hallucinations    CURRENT MEDICATIONS:  Current Outpatient Medications  Medication Instructions   aspirin EC 325 mg, Oral, Daily   lisinopril-hydrochlorothiazide  (ZESTORETIC) 20-12.5 MG tablet 1 tablet, Oral, Daily   tamoxifen (NOLVADEX) 20 mg, Oral, Daily    Objective:   PHYSICAL EXAMINATION:    VITALS:   Vitals:   04/23/22 0934  BP: 131/85  Pulse: (!) 51  SpO2: 98%  Weight: 162 lb 12.8 oz (73.8 kg)  Height: 5\' 4"  (1.626 m)    GEN:  The patient appears stated age and is in NAD. HEENT:  Normocephalic, atraumatic.  The mucous membranes are moist. The superficial temporal arteries are without ropiness or tenderness. CV:  RRR Lungs:  CTAB Neck/HEME:  There are no carotid bruits bilaterally.  Head to the L  and SB R  Neurological examination:  Orientation: The patient is alert and oriented x3.  Cranial nerves: There is good facial symmetry.  Extraocular muscles are intact. The visual fields are full to confrontational testing. The speech is fluent and clear. Soft palate rises symmetrically and there is no tongue deviation. Hearing is intact to conversational tone. Sensation: Sensation is intact to light touch throughout (facial, trunk, extremities). Vibration is intact at the bilateral big toe. There is no extinction with double simultaneous stimulation.  Motor: Strength is 5/5 in the bilateral upper and lower extremities.   Shoulder shrug is equal and symmetric.  There is no pronator drift. Deep tendon reflexes: Deep tendon reflexes are 2/4 at the bilateral biceps, triceps, brachioradialis, patella and achilles. Plantar responses are downgoing bilaterally.  Movement examination: Tone: There is normal tone in the bilateral upper extremities.  The tone in the lower extremities is normal.  Abnormal movements: has head tremor, symptoms in the "no" direction and sometimes in the "yes" direction.  There is complex, but intermittent.  She has some choreiform movements of the fingers of the right hand.  She has no rest tremor.  She has no postural tremor or intention tremor.  She has significant trouble drawing Archimedes spirals with the right hand.   When asked to copy sentences, the forearm on the right has dystonic posturing of the wrist, with flexion of the right wrist.  After writing only 2 words, she picks back up the wrist, readjust the posture, attempts to write a word and the wrist will flex again. Coordination:  There is no decremation with RAM's, with any form of RAMS, including alternating supination and pronation of the forearm, hand opening and closing, finger taps, heel taps and toe taps. Gait and Station: The patient has no difficulty arising out of a deep-seated chair without the use of the hands. The patient's stride length is good.   I have reviewed and interpreted the following labs independently   Chemistry      Component Value Date/Time   NA 139 12/26/2021 0800   K 3.7 12/26/2021 0800   CL 105 12/26/2021 0800   CO2 28 12/26/2021 0800   BUN 12 12/26/2021 0800   CREATININE 0.71 12/26/2021 0800      Component Value Date/Time   CALCIUM 9.1 12/26/2021 0800   ALKPHOS 37 (L) 12/26/2021 0800   AST 21 12/26/2021 0800   ALT 23 12/26/2021 0800   BILITOT 0.4 12/26/2021 0800      No results found for: "TSH" Lab Results  Component Value Date   WBC 7.3 12/26/2021   HGB 13.5 12/26/2021   HCT 39.2 12/26/2021   MCV 94.9 12/26/2021   PLT 223 12/26/2021      Total time spent on today's visit was 60 minutes, including both face-to-face time and nonface-to-face time.  Time included that spent on review of records (prior notes available to me/labs/imaging if pertinent), discussing treatment and goals, answering patient's questions and coordinating care.  Cc:  Courtney Paris, NP

## 2022-04-23 ENCOUNTER — Ambulatory Visit: Payer: Medicare Other | Admitting: Neurology

## 2022-04-23 ENCOUNTER — Encounter: Payer: Self-pay | Admitting: Neurology

## 2022-04-23 VITALS — BP 131/85 | HR 51 | Ht 64.0 in | Wt 162.8 lb

## 2022-04-23 DIAGNOSIS — G249 Dystonia, unspecified: Secondary | ICD-10-CM

## 2022-04-23 DIAGNOSIS — G255 Other chorea: Secondary | ICD-10-CM

## 2022-04-23 NOTE — Patient Instructions (Addendum)
A referral to [facility] has been placed for your MRI someone will contact you directly to schedule your appt. They are located at [address] [facility]. Please contact them directly by calling [phone] with any questions regarding your referral.   [facility] [address] [place],  [place]  [address] Get Driving Directions Main: [phone]  Fax: [phone] Dr. Claudette Laws

## 2022-05-13 ENCOUNTER — Encounter: Payer: Self-pay | Admitting: Physical Medicine & Rehabilitation

## 2022-05-22 ENCOUNTER — Ambulatory Visit
Admission: RE | Admit: 2022-05-22 | Discharge: 2022-05-22 | Disposition: A | Discharge status: Home / Self Care | Payer: Medicare Other | Source: Ambulatory Visit | Attending: Neurology | Admitting: Neurology

## 2022-05-22 DIAGNOSIS — G255 Other chorea: Secondary | ICD-10-CM

## 2022-05-22 MED ORDER — GADOPICLENOL 0.5 MMOL/ML IV SOLN
8.0000 mL | Freq: Once | INTRAVENOUS | Status: AC | PRN
  Administered 2022-05-22: 8 mL via INTRAVENOUS

## 2022-05-30 ENCOUNTER — Telehealth: Payer: Self-pay | Admitting: Nurse Practitioner

## 2022-05-30 NOTE — Telephone Encounter (Signed)
Patient aware of appointment change  

## 2022-06-27 ENCOUNTER — Other Ambulatory Visit: Payer: Medicare Other

## 2022-06-27 ENCOUNTER — Ambulatory Visit: Payer: Medicare Other | Admitting: Nurse Practitioner

## 2022-06-28 ENCOUNTER — Encounter: Payer: Self-pay | Admitting: Physical Medicine & Rehabilitation

## 2022-06-28 ENCOUNTER — Encounter: Payer: Medicare Other | Attending: Physical Medicine & Rehabilitation | Admitting: Physical Medicine & Rehabilitation

## 2022-06-28 VITALS — BP 112/78 | HR 84 | Ht 64.0 in | Wt 161.0 lb

## 2022-06-28 DIAGNOSIS — G248 Other dystonia: Secondary | ICD-10-CM

## 2022-06-28 DIAGNOSIS — M1810 Unilateral primary osteoarthritis of first carpometacarpal joint, unspecified hand: Secondary | ICD-10-CM

## 2022-06-28 NOTE — Progress Notes (Addendum)
Subjective:    Patient ID: Brittany Moreno, female    DOB: [birth year], 67 y.o.   MRN: [id number]  HPI 67 year old female referred for right hand shaking as chief complaint. 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 The patient has no definite weakness in the upper extremity or numbness but has experienced a loss of muscle control.  Her tremor tends to be worse when resting.  It does not bother her when sleeping.  Thumb and first finger movements noted by movement disorder neurologist although the patient also feels like she has some fourth and fifth digit abnormal movements.  Could not tolerate meds for tremor due to BP issues , both hypertension and hypotension   Unable to write  or peel potatoes Difficulty with brushing teeth  Seen by Hand surgery , had EMG and was referred to Neurology Initially seen by general neurology then referred to movement disorders specialist Dr Tat  Also with Right thumb/wrist pain 4-5 months, pain is at the base of the thumb.  No falls or trauma.  Was seen by PCP x-rays were reported as negative although not available to me for review. Pain Inventory Average Pain 4 Pain Right Now 4 My pain is aching  In the last 24 hours, has pain interfered with the following? General activity 0 Relation with others 0 Enjoyment of life 0 What TIME of day is your pain at its worst? evening Sleep (in general) Fair  Pain is worse with: some activites Pain improves with: heat/ice Relief from Meds: 5  how many minutes can you walk? Not many ability to climb steps?  yes do you drive?  yes  retired  tremor  New pt  New pt    Family History  Problem Relation Age of Onset   Hypertension Mother    Diabetes Mother    Hypertension Father    Aneurysm Father    Hypertension Sister    Diabetes Sister    Breast cancer Sister 66   Kidney cancer  Sister        dx <61, unilateral   Crohn's disease Sister    Anuerysm Sister    Tremor Maternal Grandmother    Basal cell carcinoma Son 25       multiple (back, face, shoulder, leg), +sun exposure   Breast cancer Maternal Aunt        unknown age   Thyroid cancer Maternal Aunt        unknown age   Lung cancer Maternal Uncle        dx >50, worked in Architect, smoker   Social History   Socioeconomic History   Marital status: Married    Spouse name: Not on file   Number of children: 2   Years of education: 12   Highest education level: High school graduate  Occupational History   Occupation: retired Corporate treasurer  Tobacco Use   Smoking status: Every Day    Packs/day: 1.00    Years: 50.00    Total pack years: 50.00    Types: Cigarettes   Smokeless tobacco: Never  Vaping Use   Vaping Use: Never used  Substance and Sexual Activity   Alcohol use: Yes    Alcohol/week: 36.0 standard drinks of alcohol    Types: 24 Cans of beer, 12 Standard drinks or equivalent per week    Comment: 4 cans beer every other week  Drug use: No   Sexual activity: Yes    Birth control/protection: Post-menopausal, Surgical    Comment: partial hysterectomy, tubal ligation  Other Topics Concern   Not on file  Social History Narrative   Lives with husband and 2 granddaughters in a 2 story home.  Retired Biochemist, clinical.  Has 2 children.  Education: high school.     Right handed   Retired    Chemical engineer Strain: Not on file  Food Insecurity: Not on file  Transportation Needs: Not on file  Physical Activity: Not on file  Stress: Not on file  Social Connections: Not on file   Past Surgical History:  Procedure Laterality Date   ABDOMINAL HYSTERECTOMY     BREAST LUMPECTOMY WITH RADIOACTIVE SEED LOCALIZATION Right 09/29/2020   Procedure: RIGHT BREAST LUMPECTOMY WITH RADIOACTIVE SEED LOCALIZATION;  Surgeon: Griselda Miner, MD;  Location: [facility];  Service: General;  Laterality: Right;   CARPAL TUNNEL RELEASE     CHOLECYSTECTOMY     MANDIBLE SURGERY     ROTATOR CUFF REPAIR     TUBAL LIGATION     Past Medical History:  Diagnosis Date   Family history of basal cell carcinoma    Family history of breast cancer    Family history of kidney cancer    Family history of lung cancer    Family history of thyroid cancer    High cholesterol    History of basal cell carcinoma    Hypertension    BP 112/78   Pulse 84   Ht 5\' 4"  (1.626 m)   Wt 161 lb (73 kg)   SpO2 96%   BMI 27.64 kg/m   Opioid Risk Score:   Fall Risk Score:  `1  Depression screen [facility] 2/9     06/28/2022   10:49 AM  Depression screen PHQ 2/9  Decreased Interest 0  Down, Depressed, Hopeless 0  PHQ - 2 Score 0  Altered sleeping 1  Tired, decreased energy 1  Change in appetite 0  Feeling bad or failure about yourself  0  Trouble concentrating 0  Moving slowly or fidgety/restless 0  Suicidal thoughts 0  PHQ-9 Score 2  Difficult doing work/chores Not difficult at all     Review of Systems  Constitutional:  Positive for diaphoresis.  Musculoskeletal:        Right hand  Neurological:  Positive for tremors.  All other systems reviewed and are negative.     Objective:   Physical Exam General no acute distress Mood and affect appropriate No evidence of facial droop Extraocular muscles intact Speech without dysarthria or aphasia Extremities no clubbing cyanosis or edema Musculoskeletal has tenderness over the right carpal metacarpal junction #1. Negative Finkelstein's No sensory loss over the right hand.  Neuro Motor strength is 5/5 in the right deltoid by stress of grip Tone no evidence of spasticity in the finger flexors thumb flexor wrist flexor or elbow flexors of the right upper extremity.  Observation of right upper limb during resting tremor note intermittent rhythmic activation approximately 2 to 3 Hz Right FDI RIght  FPL Right OP Right FDP        Assessment & Plan:   #1.  Tremor/dystonia right upper limb specifically affecting first dorsal interosseous muscle, flexor pollicis longus muscle, opponens pollicis muscle, flexor digitorum profundus to index finger. Recommend botulinum toxin injection given lack of response and tolerance to oral medications.  The patient has  loss of function particularly with cooking and other activities where she must grasp objects Botox dose Right FDP 25 units targeting index finger fibers. Right opponens pollicis 25 units Right first dorsal interosseous 25 units Right FPL 25 units  2.  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

## 2022-06-30 ENCOUNTER — Other Ambulatory Visit: Payer: Self-pay | Admitting: Nurse Practitioner

## 2022-06-30 DIAGNOSIS — D0511 Intraductal carcinoma in situ of right breast: Secondary | ICD-10-CM

## 2022-06-30 NOTE — Progress Notes (Unsigned)
Patient Care Team: Simona Huh, NP as PCP - General (Nurse Practitioner) Rockwell Germany, RN as Oncology Nurse Navigator Mauro Kaufmann, RN as Oncology Nurse Navigator Jovita Kussmaul, MD as Consulting Physician (General Surgery) [name], MD as Consulting Physician (Hematology) Kyung Rudd, MD as Consulting Physician (Radiation Oncology) Alla Feeling, NP as Nurse Practitioner (Nurse Practitioner) Tat, Eustace Quail, DO as Consulting Physician (Neurology)   CHIEF COMPLAINT: Follow up right breast DCIS   Oncology History Overview Note  Cancer Staging Ductal carcinoma in situ (DCIS) of right breast Staging form: Breast, AJCC 8th Edition - Clinical stage from 07/26/2020: Stage 0 (cTis (DCIS), cN0, cM0, G1, ER+, PR+, HER2: Not Assessed) - Signed by [name], MD on 08/01/2020 Stage prefix: Initial diagnosis    Ductal carcinoma in situ (DCIS) of right breast  07/18/2020 Breast MRI   IMPRESSION: Indeterminate clumped non-mass enhancement involving the UPPER central RIGHT breast spanning 1.4 x 1.2 x 1.3  cm maximally.   07/26/2020 Cancer Staging   Staging form: Breast, AJCC 8th Edition - Clinical stage from 07/26/2020: Stage 0 (cTis (DCIS), cN0, cM0, G1, ER+, PR+, HER2: Not Assessed) - Signed by [name], MD on 08/01/2020 Stage prefix: Initial diagnosis   07/26/2020 Initial Biopsy   Diagnosis Breast, right, needle core biopsy, upper inner - DUCTAL CARCINOMA IN SITU INVOLVING COMPLEX SCLEROSING LESION WITH CALCIFICATIONS. Microscopic Comment The DCIS is of intermediate nuclear grade with focal necrosis. E-cadherin is positive and CK5/6 is negative in DCIS. P63, Calponin and SMM-1 demonstrate the presence of myoepithelium throughout. Ancillary studies will be reported separately. Results reported to [facility] on 07/27/2020. Dr. Tresa Moore reviewed the case.   07/26/2020 Receptors her2    Results: IMMUNOHISTOCHEMICAL AND MORPHOMETRIC ANALYSIS PERFORMED  MANUALLY Estrogen Receptor: 95%, POSITIVE, STRONG STAINING INTENSITY Progesterone Receptor: 15%, POSITIVE, STRONG STAINING INTENSITY   07/31/2020 Initial Diagnosis   Ductal carcinoma in situ (DCIS) of right breast   08/21/2020 Genetic Testing   Negative genetic testing:  No pathogenic variants detected on the [employer] CancerNext-Expanded + RNAinsight panel. The report date is 08/21/2020.   The CancerNext-Expanded + RNAinsight gene panel offered by [employer] and includes sequencing and rearrangement analysis for the following 77 genes: AIP, ALK, APC, ATM, AXIN2, BAP1, BARD1, BLM, BMPR1A, BRCA1, BRCA2, BRIP1, CDC73, CDH1, CDK4, CDKN1B, CDKN2A, CHEK2, CTNNA1, DICER1, FANCC, FH, FLCN, GALNT12, KIF1B, LZTR1, MAX, MEN1, MET, MLH1, MSH2, MSH3, MSH6, MUTYH, NBN, NF1, NF2, NTHL1, PALB2, PHOX2B, PMS2, POT1, PRKAR1A, PTCH1, PTEN, RAD51C, RAD51D, RB1, RECQL, RET, SDHA, SDHAF2, SDHB, SDHC, SDHD, SMAD4, SMARCA4, SMARCB1, SMARCE1, STK11, SUFU, TMEM127, TP53, TSC1, TSC2, VHL and XRCC2 (sequencing and deletion/duplication); EGFR, EGLN1, HOXB13, KIT, MITF, PDGFRA, POLD1 and POLE (sequencing only); EPCAM and GREM1 (deletion/duplication only). RNA data is routinely analyzed for use in variant interpretation for all genes.   09/29/2020 Surgery   RIGHT BREAST LUMPECTOMY WITH RADIOACTIVE SEED LOCALIZATION by Dr Marlou Starks   FINAL MICROSCOPIC DIAGNOSIS:   A. BREAST, RIGHT, LUMPECTOMY:  -  Ductal carcinoma in situ, low grade, arising in a complex sclerosing  lesion  -  Margins uninvolved by carcinoma (0.4; posterior margin)  -  Previous biopsy site changes  -  See oncology table and comment below    09/29/2020 Cancer Staging   Staging form: Breast, AJCC 8th Edition - Pathologic stage from 09/29/2020: Stage Unknown (pTis (DCIS), pNX, cM0, ER+, PR+) - Signed by Alla Feeling, NP on 03/04/2021 Nuclear grade: G1   11/13/2020 - 12/11/2020 Radiation Therapy  Adjuvant Radiation by Dr Mitzi Hansen    03/05/2021 Survivorship   SCP  delivered by Santiago Glad, NP      CURRENT THERAPY: Tamoxifen, starting 01/2021  INTERVAL HISTORY Brittany Moreno returns for follow up as scheduled, last seen by me 12/26/21. She continues tamoxifen.   ROS   Past Medical History:  Diagnosis Date   Family history of basal cell carcinoma    Family history of breast cancer    Family history of kidney cancer    Family history of lung cancer    Family history of thyroid cancer    High cholesterol    History of basal cell carcinoma    Hypertension      Past Surgical History:  Procedure Laterality Date   ABDOMINAL HYSTERECTOMY     BREAST LUMPECTOMY WITH RADIOACTIVE SEED LOCALIZATION Right 09/29/2020   Procedure: RIGHT BREAST LUMPECTOMY WITH RADIOACTIVE SEED LOCALIZATION;  Surgeon: Griselda Miner, MD;  Location: [facility];  Service: General;  Laterality: Right;   CARPAL TUNNEL RELEASE     CHOLECYSTECTOMY     MANDIBLE SURGERY     ROTATOR CUFF REPAIR     TUBAL LIGATION       Outpatient Encounter Medications as of 07/01/2022  Medication Sig   aspirin EC 81 MG tablet Take 325 mg by mouth daily.   lisinopril-hydrochlorothiazide (ZESTORETIC) 20-12.5 MG tablet Take 1 tablet by mouth daily.   tamoxifen (NOLVADEX) 20 MG tablet TAKE 1 TABLET BY MOUTH  DAILY   No facility-administered encounter medications on file as of 07/01/2022.     There were no vitals filed for this visit. There is no height or weight on file to calculate BMI.   PHYSICAL EXAM GENERAL:alert, no distress and comfortable SKIN: no rash  EYES: sclera clear NECK: without mass LYMPH:  no palpable cervical or supraclavicular lymphadenopathy  LUNGS: clear with normal breathing effort HEART: regular rate & rhythm, no lower extremity edema ABDOMEN: abdomen soft, non-tender and normal bowel sounds NEURO: alert & oriented x 3 with fluent speech, no focal motor/sensory deficits Breast exam:  PAC without erythema    CBC    Component Value Date/Time   WBC 7.3  12/26/2021 0800   WBC 7.2 01/04/2019 1455   RBC 4.13 12/26/2021 0800   HGB 13.5 12/26/2021 0800   HCT 39.2 12/26/2021 0800   PLT 223 12/26/2021 0800   MCV 94.9 12/26/2021 0800   MCH 32.7 12/26/2021 0800   MCHC 34.4 12/26/2021 0800   RDW 13.1 12/26/2021 0800   LYMPHSABS 1.7 12/26/2021 0800   MONOABS 0.5 12/26/2021 0800   EOSABS 0.4 12/26/2021 0800   BASOSABS 0.1 12/26/2021 0800     CMP     Component Value Date/Time   NA 139 12/26/2021 0800   K 3.7 12/26/2021 0800   CL 105 12/26/2021 0800   CO2 28 12/26/2021 0800   GLUCOSE 103 (H) 12/26/2021 0800   BUN 12 12/26/2021 0800   CREATININE 0.71 12/26/2021 0800   CALCIUM 9.1 12/26/2021 0800   PROT 7.1 12/26/2021 0800   ALBUMIN 4.3 12/26/2021 0800   AST 21 12/26/2021 0800   ALT 23 12/26/2021 0800   ALKPHOS 37 (L) 12/26/2021 0800   BILITOT 0.4 12/26/2021 0800   GFRNONAA >60 12/26/2021 0800   GFRAA >60 01/04/2019 1455     ASSESSMENT & PLAN:  PLAN:  No orders of the defined types were placed in this encounter.     All questions were answered. The patient knows to call the  clinic with any problems, questions or concerns. No barriers to learning were detected. I spent *** counseling the patient face to face. The total time spent in the appointment was *** and more than 50% was on counseling, review of test results, and coordination of care.   Brittany Rue, NP-C @DATE @

## 2022-07-01 ENCOUNTER — Other Ambulatory Visit: Payer: Self-pay

## 2022-07-01 ENCOUNTER — Telehealth: Payer: Self-pay | Admitting: *Deleted

## 2022-07-01 ENCOUNTER — Encounter: Payer: Self-pay | Admitting: Nurse Practitioner

## 2022-07-01 ENCOUNTER — Inpatient Hospital Stay: Payer: Medicare Other | Attending: Nurse Practitioner

## 2022-07-01 ENCOUNTER — Inpatient Hospital Stay (HOSPITAL_BASED_OUTPATIENT_CLINIC_OR_DEPARTMENT_OTHER): Payer: Medicare Other | Admitting: Nurse Practitioner

## 2022-07-01 VITALS — BP 137/90 | HR 86 | Temp 98.6°F | Resp 16 | Wt 163.5 lb

## 2022-07-01 DIAGNOSIS — F1090 Alcohol use, unspecified, uncomplicated: Secondary | ICD-10-CM | POA: Diagnosis not present

## 2022-07-01 DIAGNOSIS — D0511 Intraductal carcinoma in situ of right breast: Secondary | ICD-10-CM

## 2022-07-01 DIAGNOSIS — Z803 Family history of malignant neoplasm of breast: Secondary | ICD-10-CM | POA: Insufficient documentation

## 2022-07-01 DIAGNOSIS — F17210 Nicotine dependence, cigarettes, uncomplicated: Secondary | ICD-10-CM | POA: Insufficient documentation

## 2022-07-01 DIAGNOSIS — Z923 Personal history of irradiation: Secondary | ICD-10-CM | POA: Diagnosis not present

## 2022-07-01 DIAGNOSIS — Z8051 Family history of malignant neoplasm of kidney: Secondary | ICD-10-CM | POA: Insufficient documentation

## 2022-07-01 DIAGNOSIS — Z801 Family history of malignant neoplasm of trachea, bronchus and lung: Secondary | ICD-10-CM | POA: Diagnosis not present

## 2022-07-01 DIAGNOSIS — Z79810 Long term (current) use of selective estrogen receptor modulators (SERMs): Secondary | ICD-10-CM | POA: Insufficient documentation

## 2022-07-01 DIAGNOSIS — Z7982 Long term (current) use of aspirin: Secondary | ICD-10-CM | POA: Insufficient documentation

## 2022-07-01 DIAGNOSIS — Z808 Family history of malignant neoplasm of other organs or systems: Secondary | ICD-10-CM | POA: Diagnosis not present

## 2022-07-01 DIAGNOSIS — Z85828 Personal history of other malignant neoplasm of skin: Secondary | ICD-10-CM | POA: Diagnosis not present

## 2022-07-01 LAB — CBC WITH DIFFERENTIAL (CANCER CENTER ONLY)
Abs Immature Granulocytes: 0.03 10*3/uL (ref 0.00–0.07)
Basophils Absolute: 0.1 10*3/uL (ref 0.0–0.1)
Basophils Relative: 1 %
Eosinophils Absolute: 0.3 10*3/uL (ref 0.0–0.5)
Eosinophils Relative: 4 %
HCT: 38.5 % (ref 36.0–46.0)
Hemoglobin: 13.3 g/dL (ref 12.0–15.0)
Immature Granulocytes: 0 %
Lymphocytes Relative: 30 %
Lymphs Abs: 2.1 10*3/uL (ref 0.7–4.0)
MCH: 32.6 pg (ref 26.0–34.0)
MCHC: 34.5 g/dL (ref 30.0–36.0)
MCV: 94.4 fL (ref 80.0–100.0)
Monocytes Absolute: 0.5 10*3/uL (ref 0.1–1.0)
Monocytes Relative: 7 %
Neutro Abs: 4.1 10*3/uL (ref 1.7–7.7)
Neutrophils Relative %: 58 %
Platelet Count: 224 10*3/uL (ref 150–400)
RBC: 4.08 MIL/uL (ref 3.87–5.11)
RDW: 12.7 % (ref 11.5–15.5)
WBC Count: 7.1 10*3/uL (ref 4.0–10.5)
nRBC: 0 % (ref 0.0–0.2)

## 2022-07-01 LAB — CMP (CANCER CENTER ONLY)
ALT: 28 U/L (ref 0–44)
AST: 22 U/L (ref 15–41)
Albumin: 3.8 g/dL (ref 3.5–5.0)
Alkaline Phosphatase: 37 U/L — ABNORMAL LOW (ref 38–126)
Anion gap: 5 (ref 5–15)
BUN: 11 mg/dL (ref 8–23)
CO2: 27 mmol/L (ref 22–32)
Calcium: 8.9 mg/dL (ref 8.9–10.3)
Chloride: 104 mmol/L (ref 98–111)
Creatinine: 0.71 mg/dL (ref 0.44–1.00)
GFR, Estimated: >60 mL/min (ref >60)
Glucose, Bld: 93 mg/dL (ref 70–99)
Potassium: 4.2 mmol/L (ref 3.5–5.1)
Sodium: 136 mmol/L (ref 135–145)
Total Bilirubin: 0.4 mg/dL (ref 0.3–1.2)
Total Protein: 6.7 g/dL (ref 6.5–8.1)

## 2022-07-01 NOTE — Telephone Encounter (Signed)
Requested copy of mammogram from [facility] from 04/2022.  Faxing over.   Per Cira Rue, NP she also asked that we request Dexa with this years mammogram.  04/24/2023 @ 1:15 with Bone Density at 1:30 pm.    Order relayed.    New appointment time communicated to patient over the phone.

## 2022-07-05 ENCOUNTER — Encounter: Payer: Self-pay | Admitting: Hematology

## 2022-08-08 ENCOUNTER — Encounter: Payer: Self-pay | Admitting: Physical Medicine & Rehabilitation

## 2022-08-08 ENCOUNTER — Encounter: Payer: Medicare Other | Attending: Physical Medicine & Rehabilitation | Admitting: Physical Medicine & Rehabilitation

## 2022-08-08 VITALS — BP 102/72 | HR 85 | Ht 64.0 in | Wt 161.0 lb

## 2022-08-08 DIAGNOSIS — G248 Other dystonia: Secondary | ICD-10-CM | POA: Insufficient documentation

## 2022-08-08 MED ORDER — ONABOTULINUMTOXINA 100 UNITS IJ SOLR
100.0000 [IU] | Freq: Once | INTRAMUSCULAR | Status: AC
  Administered 2022-08-08: 100 [IU] via INTRAMUSCULAR

## 2022-08-08 MED ORDER — SODIUM CHLORIDE (PF) 0.9 % IJ SOLN
2.0000 mL | Freq: Once | INTRAMUSCULAR | Status: AC
  Administered 2022-08-08: 2 mL via INTRAVENOUS

## 2022-08-08 NOTE — Progress Notes (Signed)
Botox Injection for spasticity using needle EMG guidance  Dilution: 50 Units/ml Indication: Severe spasticity which interferes with ADL,mobility and/or  hygiene and is unresponsive to medication management and other conservative care Informed consent was obtained after describing risks and benefits of the procedure with the patient. This includes bleeding, bruising, infection, excessive weakness, or medication side effects. A REMS form is on file and signed. Needle: 27g 1" needle electrode Number of units per muscle Right FDP 25 units targeting index finger fibers. Right opponens pollicis 25 units Right first dorsal interosseous 25 units Right FPL 25 units All injections were done after obtaining appropriate EMG activity and after negative drawback for blood. The patient tolerated the procedure well. Post procedure instructions were given. A followup appointment was made.

## 2022-08-08 NOTE — Patient Instructions (Signed)

## 2022-08-23 ENCOUNTER — Encounter: Payer: Medicare Other | Admitting: Physical Medicine & Rehabilitation

## 2022-08-23 DIAGNOSIS — Z79891 Long term (current) use of opiate analgesic: Secondary | ICD-10-CM

## 2022-08-23 DIAGNOSIS — G894 Chronic pain syndrome: Secondary | ICD-10-CM

## 2022-08-23 DIAGNOSIS — Z5181 Encounter for therapeutic drug level monitoring: Secondary | ICD-10-CM

## 2022-09-12 IMAGING — MR MR BREAST BILAT WO/W CM
8 of 12 series · 30 of 48 positions shown · IV contrast (8 ml gadavist)
Comparison: Prior exams.  Prior breast MRI dated 07/18/2020.
COMPARISON: Prior exams.  Prior breast MRI dated 07/18/2020.

Addendum:
CLINICAL DATA: hx ductal carcinoma in situ of right breast dx [DATE],
right biopsy and lumpectomy [DATE]; radiation [DATE]; pt c/o lump in
right breast x 6weeks; pt taking tamoxifen since [DATE]; fam hx in
sister age 60, maternal aunt unsure of age

EXAM:
BILATERAL BREAST MRI WITH AND WITHOUT CONTRAST
TECHNIQUE: Multiplanar, multisequence MR images of both breasts were obtained
prior to and following the intravenous administration of 8 ml of
Gadavist

[Series 2: t2_tirm_tra ipat (a-p) · axial · 3.0mm · 0.70mm/px · 1 of 51 slices shown]
[im 1/51]
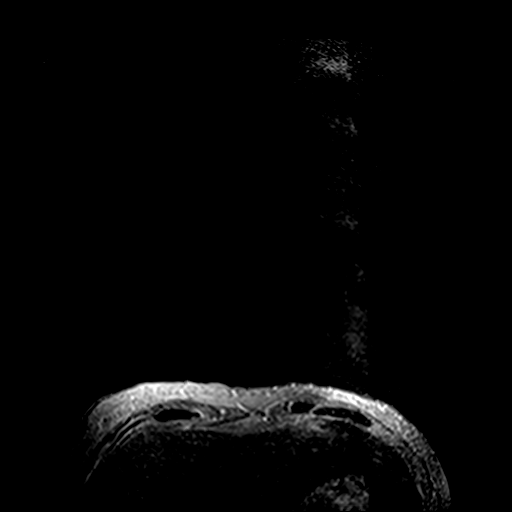

[Series 3: fl3d pre-cm no · axial · non-contrast · 1.2mm · 0.94mm/px · z∈[-126,+46]mm · 5 of 144 slices shown]
[im 1/144]
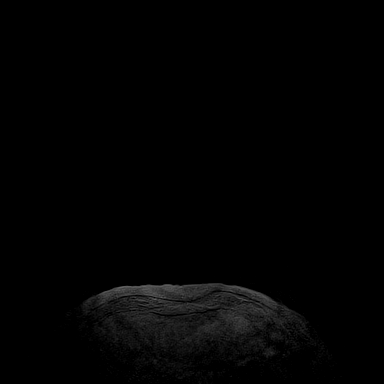
[im 36/144]
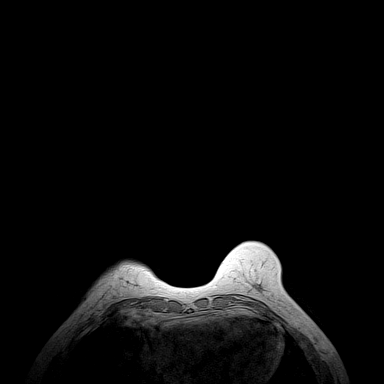
[im 72/144]
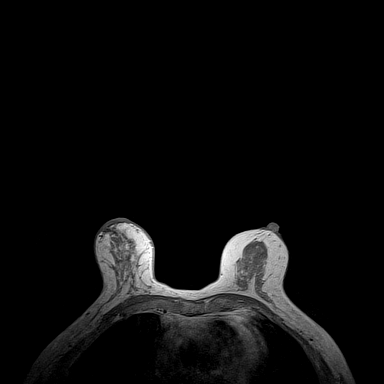
[im 108/144]
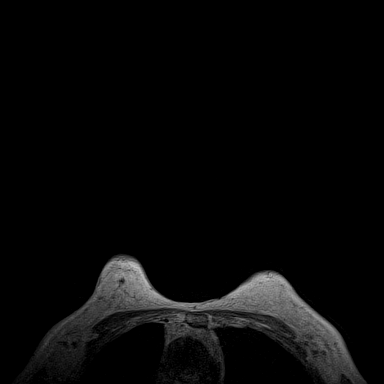
[im 144/144]
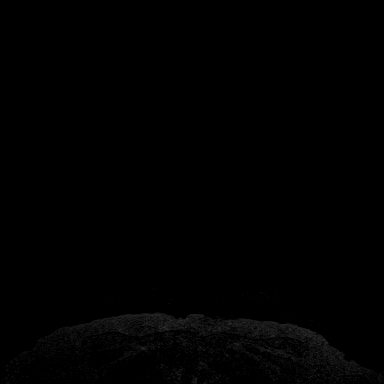

[Series 4: fl3d pre-cm · axial · non-contrast · 1.2mm · 0.94mm/px · z∈[-126,+46]mm · 5 of 144 slices shown]
[im 1/144]
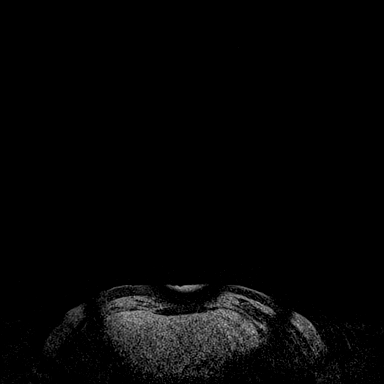
[im 36/144]
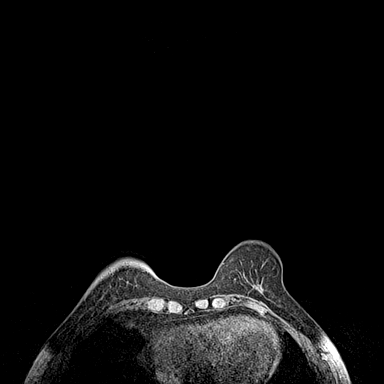
[im 72/144]
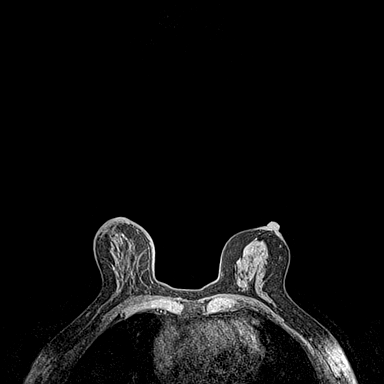
[im 108/144]
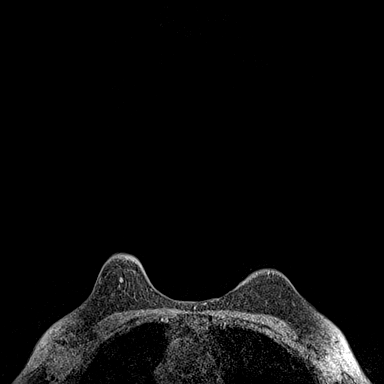
[im 144/144]
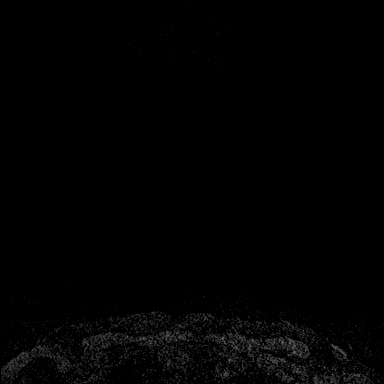

[Series 5: fl3d post-cm 20 · axial · 1.2mm · 0.94mm/px · z∈[-126,+46]mm · 5 of 144 slices shown (1 of 3)]
[im 1/144]
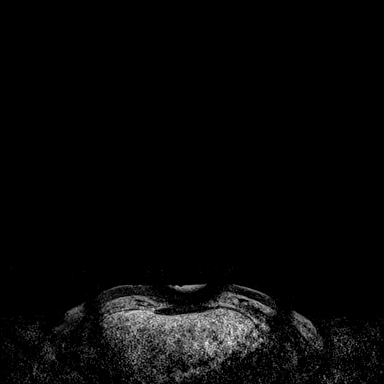
[im 36/144]
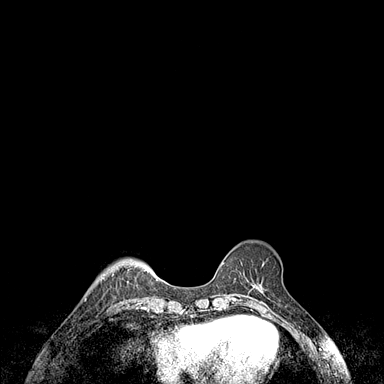
[im 72/144]
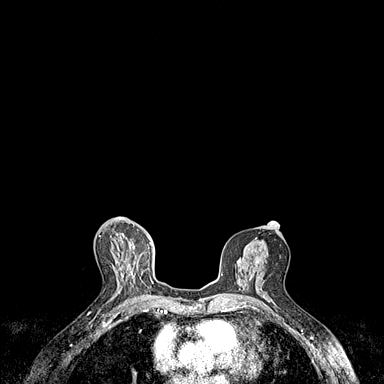
[im 108/144]
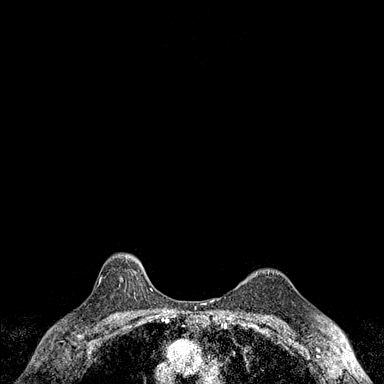
[im 144/144]
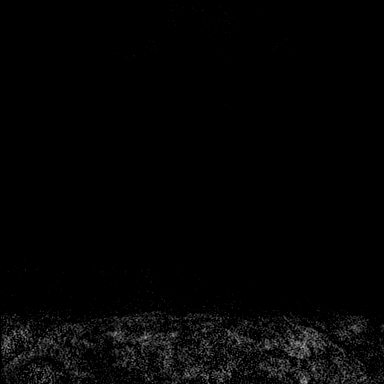

[Series 6: fl3d post-cm 20 · axial · 1.2mm · 0.94mm/px · z∈[-126,+46]mm · 5 of 144 slices shown (2 of 3)]
[im 1/144]
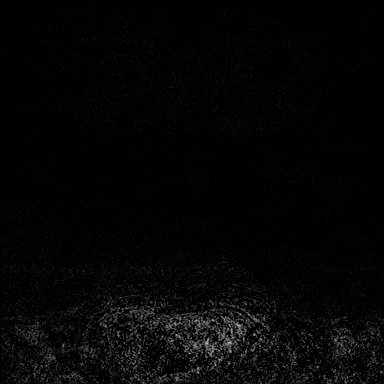
[im 36/144]
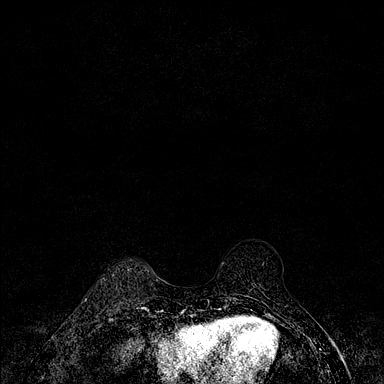
[im 72/144]
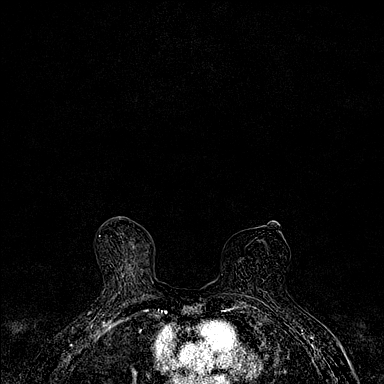
[im 108/144]
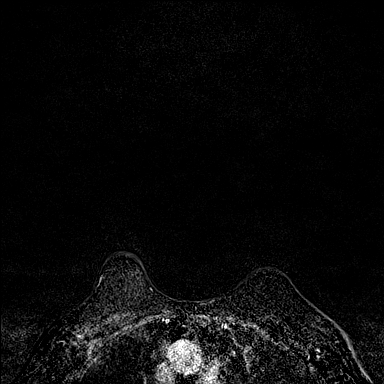
[im 144/144]
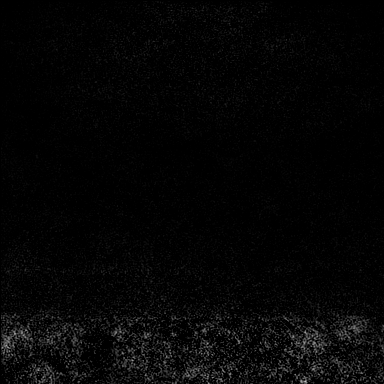

[Series 7: fl3d post-cm 20 · axial · 172.8mm · 0.94mm/px · 1 of 1 slices shown (3 of 3)]
[im 1/1]
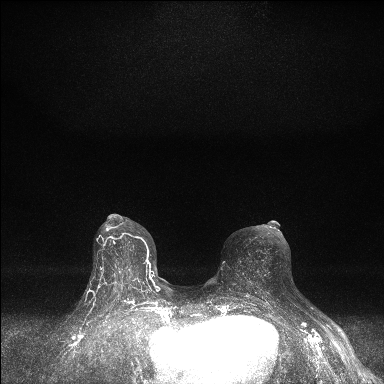

[Series 8: fl3d post-cm 3 · axial · 1.2mm · 0.94mm/px · z∈[-126,+46]mm · 6 of 144 slices shown (1 of 2)]
[im 1/144]
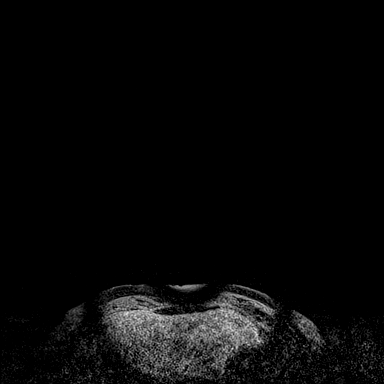
[im 29/144]
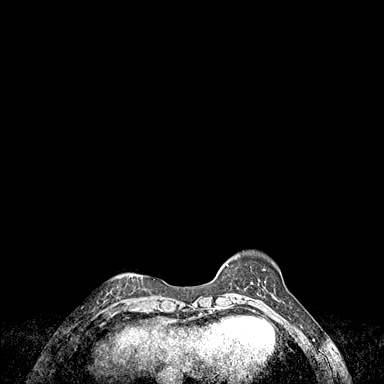
[im 58/144]
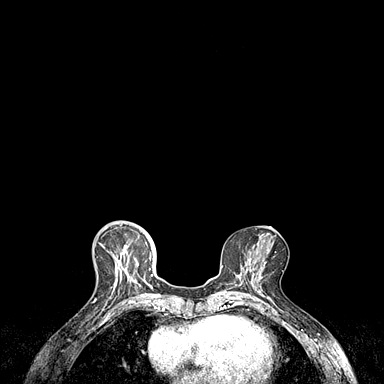
[im 86/144]
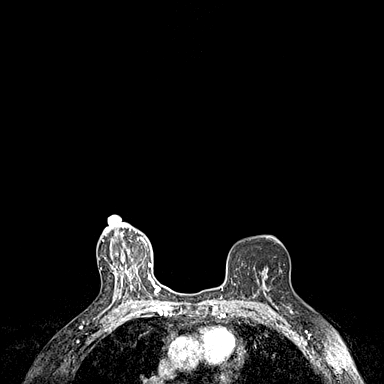
[im 115/144]
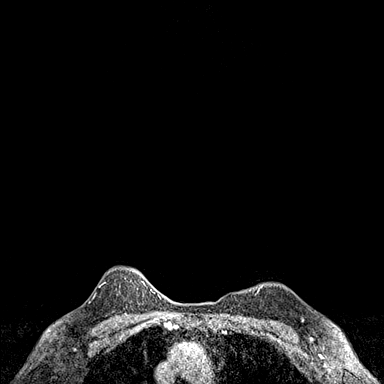
[im 144/144]
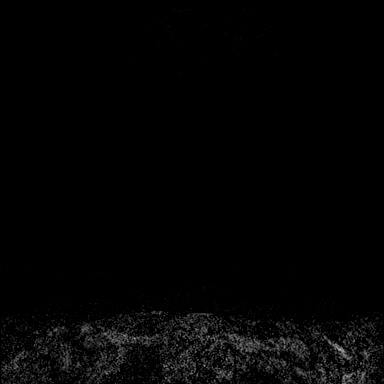

[Series 9: fl3d post-cm 3 · axial · 1.2mm · 0.94mm/px · z∈[-126,-92]mm · 2 of 144 slices shown (2 of 2)]
[im 1/144]
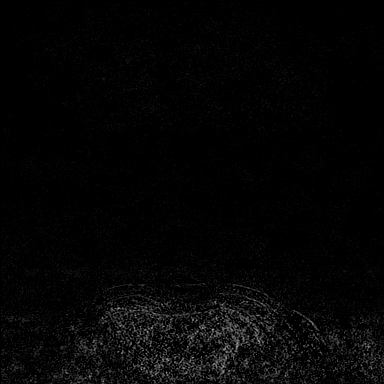
[im 29/144]
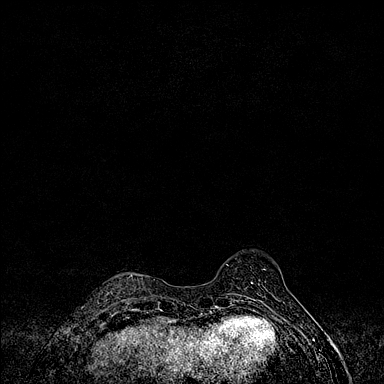

[30 of 48 positions shown; findings below may reference images not displayed]

Three-dimensional MR images were rendered by post-processing of the
original MR data on an independent workstation. The
three-dimensional MR images were interpreted, and findings are
reported in the following complete MRI report for this study. Three
dimensional images were evaluated at the independent interpreting
workstation using the DynaCAD thin client.
FINDINGS: Breast composition: c. Heterogeneous fibroglandular tissue.

Background parenchymal enhancement: Mild

Right breast: No mass or abnormal enhancement. Susceptibility
artifact noted along the inferior aspect of the breast consistent
with surgical vascular clips. There is increased T2 signal along the
breast trabecula with mild skin thickening along the upper breast
consistent with post radiation changes.

Left breast: No mass or abnormal enhancement.

Lymph nodes: No abnormal appearing lymph nodes.

Ancillary findings:  None.
IMPRESSION: 1. No evidence of new or recurrent breast malignancy.
2. Benign post lumpectomy and radiation changes of the right breast.

RECOMMENDATION:
1. Diagnostic mammography per standard post lumpectomy protocol.
Most recent mammography on the patient's imaging time line is from
04/14/2020, post biopsy clip images of the right right breast on
07/26/2020. Unless patient has had mammography elsewhere she is
currently due for bilateral mammography.
2. Annual supplemental screening breast MRI should be considered
given patient's personal history of breast carcinoma her family
history.

BI-RADS CATEGORY  2: Benign.

ADDENDUM:
Patient does have more recent mammograms, that are now available
correlation with the MRI. The most recent prior study is dated
04/19/2021.

Patient will be due for a bilateral mammography in April 2022.

*** End of Addendum ***
Three-dimensional MR images were rendered by post-processing of the
original MR data on an independent workstation. The
three-dimensional MR images were interpreted, and findings are
reported in the following complete MRI report for this study. Three
dimensional images were evaluated at the independent interpreting
workstation using the DynaCAD thin client.
FINDINGS: Breast composition: c. Heterogeneous fibroglandular tissue.

Background parenchymal enhancement: Mild

Right breast: No mass or abnormal enhancement. Susceptibility
artifact noted along the inferior aspect of the breast consistent
with surgical vascular clips. There is increased T2 signal along the
breast trabecula with mild skin thickening along the upper breast
consistent with post radiation changes.

Left breast: No mass or abnormal enhancement.

Lymph nodes: No abnormal appearing lymph nodes.

Ancillary findings:  None.
IMPRESSION: 1. No evidence of new or recurrent breast malignancy.
2. Benign post lumpectomy and radiation changes of the right breast.

RECOMMENDATION:
1. Diagnostic mammography per standard post lumpectomy protocol.
Most recent mammography on the patient's imaging time line is from
04/14/2020, post biopsy clip images of the right right breast on
07/26/2020. Unless patient has had mammography elsewhere she is
currently due for bilateral mammography.
2. Annual supplemental screening breast MRI should be considered
given patient's personal history of breast carcinoma her family
history.

BI-RADS CATEGORY  2: Benign.

## 2022-10-16 ENCOUNTER — Ambulatory Visit
Admission: RE | Admit: 2022-10-16 | Discharge: 2022-10-16 | Disposition: A | Discharge status: Home / Self Care | Payer: Medicare Other | Source: Ambulatory Visit | Attending: Nurse Practitioner | Admitting: Nurse Practitioner

## 2022-10-16 DIAGNOSIS — D0511 Intraductal carcinoma in situ of right breast: Secondary | ICD-10-CM

## 2022-10-16 MED ORDER — GADOPICLENOL 0.5 MMOL/ML IV SOLN
7.0000 mL | Freq: Once | INTRAVENOUS | Status: AC | PRN
  Administered 2022-10-16: 7 mL via INTRAVENOUS

## 2022-12-01 ENCOUNTER — Other Ambulatory Visit: Payer: Self-pay | Admitting: Nurse Practitioner

## 2022-12-28 ENCOUNTER — Other Ambulatory Visit: Payer: Self-pay

## 2022-12-28 DIAGNOSIS — D0511 Intraductal carcinoma in situ of right breast: Secondary | ICD-10-CM

## 2022-12-29 NOTE — Progress Notes (Unsigned)
Patient Care Team: Courtney Paris, NP as PCP - General (Nurse Practitioner) Donnelly Angelica, RN as Oncology Nurse Navigator Pershing Proud, RN as Oncology Nurse Navigator Griselda Miner, MD as Consulting Physician (General Surgery) Malachy Mood, MD as Consulting Physician (Hematology) Dorothy Puffer, MD as Consulting Physician (Radiation Oncology) Pollyann Samples, NP as Nurse Practitioner (Nurse Practitioner) Tat, Octaviano Batty, DO as Consulting Physician (Neurology)   CHIEF COMPLAINT: Follow up right breast DCIS  Oncology History Overview Note  Cancer Staging Ductal carcinoma in situ (DCIS) of right breast Staging form: Breast, AJCC 8th Edition - Clinical stage from 07/26/2020: Stage 0 (cTis (DCIS), cN0, cM0, G1, ER+, PR+, HER2: Not Assessed) - Signed by Malachy Mood, MD on 08/01/2020 Stage prefix: Initial diagnosis    Ductal carcinoma in situ (DCIS) of right breast  07/18/2020 Breast MRI   IMPRESSION: Indeterminate clumped non-mass enhancement involving the UPPER central RIGHT breast spanning 1.4 x 1.2 x 1.3  cm maximally.   07/26/2020 Cancer Staging   Staging form: Breast, AJCC 8th Edition - Clinical stage from 07/26/2020: Stage 0 (cTis (DCIS), cN0, cM0, G1, ER+, PR+, HER2: Not Assessed) - Signed by Malachy Mood, MD on 08/01/2020 Stage prefix: Initial diagnosis   07/26/2020 Initial Biopsy   Diagnosis Breast, right, needle core biopsy, upper inner - DUCTAL CARCINOMA IN SITU INVOLVING COMPLEX SCLEROSING LESION WITH CALCIFICATIONS. Microscopic Comment The DCIS is of intermediate nuclear grade with focal necrosis. E-cadherin is positive and CK5/6 is negative in DCIS. P63, Calponin and SMM-1 demonstrate the presence of myoepithelium throughout. Ancillary studies will be reported separately. Results reported to [facility] on 07/27/2020. Dr. Berneice Heinrich reviewed the case.   07/26/2020 Receptors her2    Results: IMMUNOHISTOCHEMICAL AND MORPHOMETRIC ANALYSIS PERFORMED  MANUALLY Estrogen Receptor: 95%, POSITIVE, STRONG STAINING INTENSITY Progesterone Receptor: 15%, POSITIVE, STRONG STAINING INTENSITY   07/31/2020 Initial Diagnosis   Ductal carcinoma in situ (DCIS) of right breast   08/21/2020 Genetic Testing   Negative genetic testing:  No pathogenic variants detected on the [employer] CancerNext-Expanded + RNAinsight panel. The report date is 08/21/2020.   The CancerNext-Expanded + RNAinsight gene panel offered by [employer] and includes sequencing and rearrangement analysis for the following 77 genes: AIP, ALK, APC, ATM, AXIN2, BAP1, BARD1, BLM, BMPR1A, BRCA1, BRCA2, BRIP1, CDC73, CDH1, CDK4, CDKN1B, CDKN2A, CHEK2, CTNNA1, DICER1, FANCC, FH, FLCN, GALNT12, KIF1B, LZTR1, MAX, MEN1, MET, MLH1, MSH2, MSH3, MSH6, MUTYH, NBN, NF1, NF2, NTHL1, PALB2, PHOX2B, PMS2, POT1, PRKAR1A, PTCH1, PTEN, RAD51C, RAD51D, RB1, RECQL, RET, SDHA, SDHAF2, SDHB, SDHC, SDHD, SMAD4, SMARCA4, SMARCB1, SMARCE1, STK11, SUFU, TMEM127, TP53, TSC1, TSC2, VHL and XRCC2 (sequencing and deletion/duplication); EGFR, EGLN1, HOXB13, KIT, MITF, PDGFRA, POLD1 and POLE (sequencing only); EPCAM and GREM1 (deletion/duplication only). RNA data is routinely analyzed for use in variant interpretation for all genes.   09/29/2020 Surgery   RIGHT BREAST LUMPECTOMY WITH RADIOACTIVE SEED LOCALIZATION by Dr Carolynne Edouard   FINAL MICROSCOPIC DIAGNOSIS:   A. BREAST, RIGHT, LUMPECTOMY:  -  Ductal carcinoma in situ, low grade, arising in a complex sclerosing  lesion  -  Margins uninvolved by carcinoma (0.4; posterior margin)  -  Previous biopsy site changes  -  See oncology table and comment below    09/29/2020 Cancer Staging   Staging form: Breast, AJCC 8th Edition - Pathologic stage from 09/29/2020: Stage Unknown (pTis (DCIS), pNX, cM0, ER+, PR+) - Signed by Pollyann Samples, NP on 03/04/2021 Nuclear grade: G1   11/13/2020 - 12/11/2020 Radiation Therapy  Adjuvant Radiation by Dr Mitzi Hansen    03/05/2021 Survivorship   SCP  delivered by Santiago Glad, NP      CURRENT THERAPY: Tamoxifen, starting 01/2021   INTERVAL HISTORY Brittany Moreno returns for follow up as scheduled, last seen by me 07/01/22.   ROS   Past Medical History:  Diagnosis Date   Family history of basal cell carcinoma    Family history of breast cancer    Family history of kidney cancer    Family history of lung cancer    Family history of thyroid cancer    High cholesterol    History of basal cell carcinoma    Hypertension      Past Surgical History:  Procedure Laterality Date   ABDOMINAL HYSTERECTOMY     BREAST LUMPECTOMY WITH RADIOACTIVE SEED LOCALIZATION Right 09/29/2020   Procedure: RIGHT BREAST LUMPECTOMY WITH RADIOACTIVE SEED LOCALIZATION;  Surgeon: Griselda Miner, MD;  Location: [facility];  Service: General;  Laterality: Right;   CARPAL TUNNEL RELEASE     CHOLECYSTECTOMY     MANDIBLE SURGERY     ROTATOR CUFF REPAIR     TUBAL LIGATION       Outpatient Encounter Medications as of 12/30/2022  Medication Sig   aspirin EC 81 MG tablet Take 325 mg by mouth daily.   Fluticasone Furoate-Vilanterol (BREO ELLIPTA) 50-25 MCG/ACT AEPB Inhale 1 puff into the lungs once.   lisinopril-hydrochlorothiazide (ZESTORETIC) 20-12.5 MG tablet Take 1 tablet by mouth daily.   Multiple Vitamin (MULTIVITAMIN) tablet Take 1 tablet by mouth daily.   tamoxifen (NOLVADEX) 20 MG tablet TAKE 1 TABLET BY MOUTH DAILY   No facility-administered encounter medications on file as of 12/30/2022.     There were no vitals filed for this visit. There is no height or weight on file to calculate BMI.   PHYSICAL EXAM GENERAL:alert, no distress and comfortable SKIN: no rash  EYES: sclera clear NECK: without mass LYMPH:  no palpable cervical or supraclavicular lymphadenopathy  LUNGS: clear with normal breathing effort HEART: regular rate & rhythm, no lower extremity edema ABDOMEN: abdomen soft, non-tender and normal bowel sounds NEURO: alert &  oriented x 3 with fluent speech, no focal motor/sensory deficits Breast exam:  PAC without erythema    CBC    Component Value Date/Time   WBC 7.1 07/01/2022 0827   WBC 7.2 01/04/2019 1455   RBC 4.08 07/01/2022 0827   HGB 13.3 07/01/2022 0827   HCT 38.5 07/01/2022 0827   PLT 224 07/01/2022 0827   MCV 94.4 07/01/2022 0827   MCH 32.6 07/01/2022 0827   MCHC 34.5 07/01/2022 0827   RDW 12.7 07/01/2022 0827   LYMPHSABS 2.1 07/01/2022 0827   MONOABS 0.5 07/01/2022 0827   EOSABS 0.3 07/01/2022 0827   BASOSABS 0.1 07/01/2022 0827     CMP     Component Value Date/Time   NA 136 07/01/2022 0827   K 4.2 07/01/2022 0827   CL 104 07/01/2022 0827   CO2 27 07/01/2022 0827   GLUCOSE 93 07/01/2022 0827   BUN 11 07/01/2022 0827   CREATININE 0.71 07/01/2022 0827   CALCIUM 8.9 07/01/2022 0827   PROT 6.7 07/01/2022 0827   ALBUMIN 3.8 07/01/2022 0827   AST 22 07/01/2022 0827   ALT 28 07/01/2022 0827   ALKPHOS 37 (L) 07/01/2022 0827   BILITOT 0.4 07/01/2022 0827   GFRNONAA >60 07/01/2022 0827   GFRAA >60 01/04/2019 1455     ASSESSMENT & PLAN:Brittany Moreno is a  67 y.o. female with    1. Right breast DCIS, low grade,  ER+/PR+ -Diagnosed 07/26/2020, S/p right lumpectomy on 09/29/20 (Dr. Carolynne Edouard) and adjuvant radiation (Dr. Mitzi Hansen) 6/13-7/11/22. -she began tamoxifen in 01/2021, tolerating well with tolerable hot flashes and joint pain -Last mammo 04/22/22 and MRI 10/16/22 both negative.  mammo due 04/2023   2. Bone Health -baseline DEXA performed 04/19/21 showed osteopenia (T-score -1.2 at AP spine) -She is tolerating low-dose calcium/vitamin D.  Encouraged her to continue weightbearing exercise  -We reviewed the bone strengthening quality of tamoxifen  -Repeat DEXA 04/2023   3. Alcohol and Smoking cessation -She smokes 1ppd for the past 50 years. She has been counseled on smoking cessation before and tried to quit using multiple methods without success.  She does not plan to try to quit  again -Counseled to limit alcohol, engage in healthy lifestyle, and continue age-appropriate health maintenance/cancer screenings -She reportedly had a negative lung cancer screening CT -Due colonoscopy in 2024, per pt -Currently under more stress with the recent passing of her sister and husband's ongoing health issues.  Emotional support provided        PLAN:  No orders of the defined types were placed in this encounter.     All questions were answered. The patient knows to call the clinic with any problems, questions or concerns. No barriers to learning were detected. I spent *** counseling the patient face to face. The total time spent in the appointment was *** and more than 50% was on counseling, review of test results, and coordination of care.   Santiago Glad, NP-C @DATE @

## 2022-12-30 ENCOUNTER — Inpatient Hospital Stay: Payer: Medicare Other | Attending: Nurse Practitioner | Admitting: Nurse Practitioner

## 2022-12-30 ENCOUNTER — Encounter: Payer: Self-pay | Admitting: Nurse Practitioner

## 2022-12-30 ENCOUNTER — Other Ambulatory Visit: Payer: Self-pay

## 2022-12-30 ENCOUNTER — Inpatient Hospital Stay: Payer: Medicare Other

## 2022-12-30 VITALS — BP 122/73 | HR 89 | Temp 98.5°F | Resp 16 | Ht 64.0 in | Wt 167.9 lb

## 2022-12-30 DIAGNOSIS — Z803 Family history of malignant neoplasm of breast: Secondary | ICD-10-CM | POA: Insufficient documentation

## 2022-12-30 DIAGNOSIS — F1090 Alcohol use, unspecified, uncomplicated: Secondary | ICD-10-CM | POA: Diagnosis not present

## 2022-12-30 DIAGNOSIS — Z808 Family history of malignant neoplasm of other organs or systems: Secondary | ICD-10-CM | POA: Diagnosis not present

## 2022-12-30 DIAGNOSIS — Z79810 Long term (current) use of selective estrogen receptor modulators (SERMs): Secondary | ICD-10-CM | POA: Insufficient documentation

## 2022-12-30 DIAGNOSIS — Z170 Estrogen receptor positive status [ER+]: Secondary | ICD-10-CM | POA: Diagnosis not present

## 2022-12-30 DIAGNOSIS — Z8051 Family history of malignant neoplasm of kidney: Secondary | ICD-10-CM | POA: Diagnosis not present

## 2022-12-30 DIAGNOSIS — D0511 Intraductal carcinoma in situ of right breast: Secondary | ICD-10-CM | POA: Diagnosis not present

## 2022-12-30 DIAGNOSIS — Z801 Family history of malignant neoplasm of trachea, bronchus and lung: Secondary | ICD-10-CM | POA: Insufficient documentation

## 2022-12-30 DIAGNOSIS — Z923 Personal history of irradiation: Secondary | ICD-10-CM | POA: Diagnosis not present

## 2022-12-30 DIAGNOSIS — F17210 Nicotine dependence, cigarettes, uncomplicated: Secondary | ICD-10-CM | POA: Insufficient documentation

## 2022-12-30 DIAGNOSIS — M8580 Other specified disorders of bone density and structure, unspecified site: Secondary | ICD-10-CM | POA: Diagnosis not present

## 2022-12-30 LAB — CMP (CANCER CENTER ONLY)
ALT: 25 U/L (ref 0–44)
AST: 21 U/L (ref 15–41)
Albumin: 3.9 g/dL (ref 3.5–5.0)
Alkaline Phosphatase: 33 U/L — ABNORMAL LOW (ref 38–126)
Anion gap: 7 (ref 5–15)
BUN: 16 mg/dL (ref 8–23)
CO2: 27 mmol/L (ref 22–32)
Calcium: 8.9 mg/dL (ref 8.9–10.3)
Chloride: 105 mmol/L (ref 98–111)
Creatinine: 0.79 mg/dL (ref 0.44–1.00)
GFR, Estimated: >60 mL/min (ref >60)
Glucose, Bld: 115 mg/dL — ABNORMAL HIGH (ref 70–99)
Potassium: 4.1 mmol/L (ref 3.5–5.1)
Sodium: 139 mmol/L (ref 135–145)
Total Bilirubin: 0.3 mg/dL (ref 0.3–1.2)
Total Protein: 6 g/dL — ABNORMAL LOW (ref 6.5–8.1)

## 2022-12-30 LAB — CBC WITH DIFFERENTIAL (CANCER CENTER ONLY)
Abs Immature Granulocytes: 0.02 10*3/uL (ref 0.00–0.07)
Basophils Absolute: 0.1 10*3/uL (ref 0.0–0.1)
Basophils Relative: 1 %
Eosinophils Absolute: 0.4 10*3/uL (ref 0.0–0.5)
Eosinophils Relative: 5 %
HCT: 38.4 % (ref 36.0–46.0)
Hemoglobin: 12.9 g/dL (ref 12.0–15.0)
Immature Granulocytes: 0 %
Lymphocytes Relative: 29 %
Lymphs Abs: 2.3 10*3/uL (ref 0.7–4.0)
MCH: 32.5 pg (ref 26.0–34.0)
MCHC: 33.6 g/dL (ref 30.0–36.0)
MCV: 96.7 fL (ref 80.0–100.0)
Monocytes Absolute: 0.4 10*3/uL (ref 0.1–1.0)
Monocytes Relative: 5 %
Neutro Abs: 4.8 10*3/uL (ref 1.7–7.7)
Neutrophils Relative %: 60 %
Platelet Count: 210 10*3/uL (ref 150–400)
RBC: 3.97 MIL/uL (ref 3.87–5.11)
RDW: 12.9 % (ref 11.5–15.5)
WBC Count: 8.1 10*3/uL (ref 4.0–10.5)
nRBC: 0 % (ref 0.0–0.2)

## 2023-01-16 ENCOUNTER — Other Ambulatory Visit: Payer: Self-pay

## 2023-01-16 MED ORDER — TAMOXIFEN CITRATE 20 MG PO TABS: 20.0000 mg | ORAL_TABLET | Freq: Every day | ORAL | 2 refills | Status: AC

## 2023-04-29 ENCOUNTER — Encounter: Payer: Self-pay | Admitting: Hematology

## 2023-05-12 ENCOUNTER — Encounter: Payer: Self-pay | Admitting: Hematology

## 2023-07-01 NOTE — Assessment & Plan Note (Addendum)
low grade,  ER+/PR+ -Diagnosed 07/26/2020, S/p right lumpectomy on 09/29/20 (Dr. Carolynne Edouard) and adjuvant radiation (Dr. Mitzi Hansen) 6/13-7/11/22. -she began tamoxifen in 01/2021, tolerating well with tolerable hot flashes and joint pain -Last mammo 04/22/22 and MRI 10/16/22 both negative. -Ms. Brittany Moreno is clinically doing well.  Tolerating tamoxifen with stable hot flashes.  Exam is benign, labs are stable.  Overall no clinical concern for recurrence. -We discussed newer data showing similar efficacy in low-dose tamoxifen for 3 years versus tamoxifen 20 mg for 5 years; she is ok to continue 20 mg -04/24/2023 -screening mammogram was benign after diagnostic images were obtained of the right breast.  MRI should be scheduled in May/2025. -04/24/2023 -DEXA scan showing osteopenia.  There is distinctly significant bone loss in bilateral hips with stable lumbar spine.

## 2023-07-01 NOTE — Progress Notes (Unsigned)
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 Carolynne Edouard) and adjuvant radiation (Dr. Mitzi Hansen) 6/13-7/11/22. -she began tamoxifen in 01/2021, tolerating well with tolerable hot flashes and joint pain -Last mammo 04/22/22 and MRI 10/16/22 both negative. -Brittany Moreno is clinically doing well.  Tolerating tamoxifen with stable hot flashes.  Exam is benign, labs are stable.  Overall no clinical concern for recurrence. -We discussed newer data showing similar efficacy in low-dose tamoxifen for 3 years versus tamoxifen 20 mg for 5 years; she is ok to continue 20 mg -04/24/2023 -screening mammogram was benign after diagnostic images were obtained of the right breast.  MRI should be scheduled in May/2025. -04/24/2023 -DEXA scan showing osteopenia.  There is distinctly significant bone loss in bilateral hips with stable lumbar spine.  The patient does take calcium and 5000 IU vitamin D every day. -Breast MRI with and without contrast ordered for May 2025.  Will be done at [facility] mammography. -Continue breast cancer surveillance.   Plan: Labs reviewed  -CBC showing WBC 8.3; Hgb 13.7; Hct 42.3; Plt 266; Anc 5.2 -CMP - K 4.9; glucose 145; BUN 12; Creatinine 0.76; eGFR >60; Ca 9.8; LFTs normal.   -reviewed results of most recent screening and diagnostic mammogram done  04/2023.  -MRI with and without contrast to be scheduled for 10/2023.  -Continue tamoxifen 20 mg daily.  Was started 01/2021. -repeat labs and follow up 6 months.   The patient understands the plans discussed today and is in agreement with them.  She knows to contact our office if she develops concerns prior to her next appointment.  I provided 25 minutes of face-to-face time during this encounter and > 50% was spent counseling as documented under my assessment and plan.    Carlean Jews, NP  [facility] - A DEPT OF [facility] [address] [place] [address] Dept: [phone] Dept Fax: [phone]   Orders Placed This Encounter  Procedures   MR BREAST BILATERAL W WO CONTRAST INC CAD    Patient goes to [facility] for breast imaging.    Standing Status:   Future    Expected Date:   10/15/2023    Expiration Date:   07/01/2024    If indicated for the ordered procedure, I authorize the administration of contrast media per Radiology protocol:   Yes    What is the patient's sedation requirement?:   No Sedation    Does the patient have a pacemaker or implanted devices?:   No    Radiology Contrast Protocol - do NOT remove file path:   [email or web address]    Preferred imaging location?:   External      CHIEF COMPLAINT:  CC: DCIS right breast  Current Treatment: Tamoxifen started 01/2021  INTERVAL HISTORY:  Brittany Moreno is here today for repeat clinical assessment.  Patient last saw Clayborn Heron, NP  on 12/30/2022.  Tolerating tamoxifen well with manageable hot flashes.  States she does have night sweats.  They are manageable.  Denies unusual joint pain.  Screening mammogram done 04/24/2023 required additional images of the right breast which were benign.  Will be scheduled for MRI in May 2025.  She did have DEXA scan done 04/24/2023.  She is considered to be osteopenic.  There is a  statistically significant decrease in the density of both hips however, lumbar spine is stable.  She does take calcium with 5000 IU vitamin D every day.  She will be scheduled for MRI of bilateral breasts in May 2025 at [facility] mammography.  She denies chest pain, chest pressure, or shortness of breath. She denies headaches or visual disturbances. She denies abdominal pain, nausea, vomiting, or changes in bowel or bladder habits.  She denies fevers or chills. Her appetite is good. Her weight has increased 6 pounds over last 9 months .  The patient states that she does continue to smoke approximately 1 pack of cigarettes per day.  She does understand that this can impact a fibrous density of her breasts.  She also understands the impact this can have on her lungs and general health.   I have reviewed the past medical history, past surgical history, social history and family history with the patient and they are unchanged from previous note.  ALLERGIES:  is allergic to losartan, penicillins, sulfamethoxazole-trimethoprim, erythromycin base, prednisone, erythromycin, and percocet [oxycodone-acetaminophen].  MEDICATIONS:  Current Outpatient Medications  Medication Sig Dispense Refill   aspirin EC 81 MG tablet Take 325 mg by mouth daily.     cholecalciferol (VITAMIN D3) 25 MCG (1000 UNIT) tablet Take 5,000 Units by mouth daily.     Fluticasone Furoate-Vilanterol (BREO ELLIPTA) 50-25 MCG/ACT AEPB Inhale 1 puff into the lungs once.     lisinopril-hydrochlorothiazide (ZESTORETIC) 20-12.5 MG tablet Take 1 tablet by mouth daily.     Multiple Vitamin (MULTIVITAMIN) tablet Take 1 tablet by mouth daily.     tamoxifen (NOLVADEX) 20 MG tablet Take 1 tablet (20 mg total) by mouth daily. 100 tablet 2   No current facility-administered medications for this visit.    HISTORY OF PRESENT ILLNESS:   Oncology History Overview Note  Cancer Staging Ductal carcinoma in situ (DCIS) of right breast Staging form: Breast,  AJCC 8th Edition - Clinical stage from 07/26/2020: Stage 0 (cTis (DCIS), cN0, cM0, G1, ER+, PR+, HER2: Not Assessed) - Signed by Malachy Mood, MD on 08/01/2020 Stage prefix: Initial diagnosis    Ductal carcinoma in situ (DCIS) of right breast  07/18/2020 Breast MRI   IMPRESSION: Indeterminate clumped non-mass enhancement involving the UPPER central RIGHT breast spanning 1.4 x 1.2 x 1.3  cm maximally.   07/26/2020 Cancer Staging   Staging form: Breast, AJCC 8th Edition - Clinical stage from 07/26/2020: Stage 0 (cTis (DCIS), cN0, cM0, G1, ER+, PR+, HER2: Not Assessed) - Signed by Malachy Mood, MD on 08/01/2020 Stage prefix: Initial diagnosis   07/26/2020 Initial Biopsy   Diagnosis Breast, right, needle core biopsy, upper inner - DUCTAL CARCINOMA IN SITU INVOLVING COMPLEX SCLEROSING LESION WITH CALCIFICATIONS. Microscopic Comment The DCIS is of intermediate nuclear grade with focal necrosis. E-cadherin is positive and CK5/6 is negative in DCIS. P63, Calponin and SMM-1 demonstrate the presence of myoepithelium throughout. Ancillary studies will be reported separately. Results reported to [facility] on 07/27/2020. Dr. Berneice Heinrich reviewed the case.   07/26/2020 Receptors her2    Results: IMMUNOHISTOCHEMICAL AND MORPHOMETRIC  ANALYSIS PERFORMED MANUALLY Estrogen Receptor: 95%, POSITIVE, STRONG STAINING INTENSITY Progesterone Receptor: 15%, POSITIVE, STRONG STAINING INTENSITY   07/31/2020 Initial Diagnosis   Ductal carcinoma in situ (DCIS) of right breast   08/21/2020 Genetic Testing   Negative genetic testing:  No pathogenic variants detected on the [employer] CancerNext-Expanded + RNAinsight panel. The report date is 08/21/2020.   The CancerNext-Expanded + RNAinsight gene panel offered by [employer] and includes sequencing and rearrangement analysis for the following 77 genes: AIP, ALK, APC, ATM, AXIN2, BAP1, BARD1, BLM, BMPR1A, BRCA1, BRCA2, BRIP1, CDC73, CDH1, CDK4, CDKN1B, CDKN2A,  CHEK2, CTNNA1, DICER1, FANCC, FH, FLCN, GALNT12, KIF1B, LZTR1, MAX, MEN1, MET, MLH1, MSH2, MSH3, MSH6, MUTYH, NBN, NF1, NF2, NTHL1, PALB2, PHOX2B, PMS2, POT1, PRKAR1A, PTCH1, PTEN, RAD51C, RAD51D, RB1, RECQL, RET, SDHA, SDHAF2, SDHB, SDHC, SDHD, SMAD4, SMARCA4, SMARCB1, SMARCE1, STK11, SUFU, TMEM127, TP53, TSC1, TSC2, VHL and XRCC2 (sequencing and deletion/duplication); EGFR, EGLN1, HOXB13, KIT, MITF, PDGFRA, POLD1 and POLE (sequencing only); EPCAM and GREM1 (deletion/duplication only). RNA data is routinely analyzed for use in variant interpretation for all genes.   09/29/2020 Surgery   RIGHT BREAST LUMPECTOMY WITH RADIOACTIVE SEED LOCALIZATION by Dr Carolynne Edouard   FINAL MICROSCOPIC DIAGNOSIS:   A. BREAST, RIGHT, LUMPECTOMY:  -  Ductal carcinoma in situ, low grade, arising in a complex sclerosing  lesion  -  Margins uninvolved by carcinoma (0.4; posterior margin)  -  Previous biopsy site changes  -  See oncology table and comment below    09/29/2020 Cancer Staging   Staging form: Breast, AJCC 8th Edition - Pathologic stage from 09/29/2020: Stage Unknown (pTis (DCIS), pNX, cM0, ER+, PR+) - Signed by Pollyann Samples, NP on 03/04/2021 Nuclear grade: G1   11/13/2020 - 12/11/2020 Radiation Therapy   Adjuvant Radiation by Dr Mitzi Hansen    03/05/2021 Survivorship   SCP delivered by Santiago Glad, NP       REVIEW OF SYSTEMS:   Constitutional: Denies fevers, chills or abnormal weight loss Eyes: Denies blurriness of vision Ears, nose, mouth, throat, and face: Denies mucositis or sore throat Respiratory: Denies cough, dyspnea or wheezes Cardiovascular: Denies palpitation, chest discomfort or lower extremity swelling Gastrointestinal:  Denies nausea, heartburn or change in bowel habits Skin: Denies abnormal skin rashes Lymphatics: Denies new lymphadenopathy or easy bruising Neurological:Denies numbness, tingling or new weaknesses Behavioral/Psych: Mood is stable, no new changes  All other systems were  reviewed with the patient and are negative.   VITALS:   Today's Vitals   07/02/23 0835 07/02/23 0848  BP: 138/83   Pulse: 89   Resp: 16   Temp: 97.9 F (36.6 C)   TempSrc: Temporal   SpO2: 100%   Weight: 173 lb 1.6 oz (78.5 kg)   PainSc:  0-No pain   Body mass index is 29.71 kg/m.   Wt Readings from Last 3 Encounters:  07/02/23 173 lb 1.6 oz (78.5 kg)  12/30/22 167 lb 14.4 oz (76.2 kg)  08/08/22 161 lb (73 kg)    Body mass index is 29.71 kg/m.  Performance status (ECOG): 1 - Symptomatic but completely ambulatory  PHYSICAL EXAM:   GENERAL:alert, no distress and comfortable SKIN: skin color, texture, turgor are normal, no rashes or significant lesions EYES: normal, Conjunctiva are pink and non-injected, sclera clear OROPHARYNX:no exudate, no erythema and lips, buccal mucosa, and tongue normal  NECK: supple, thyroid normal size, non-tender, without nodularity LYMPH:  no palpable lymphadenopathy in the cervical, axillary or inguinal LUNGS: There are expiratory wheezes heard throughout the lung fields.  Congested, nonproductive cough noted. HEART: regular rate & rhythm and no murmurs and no lower extremity edema ABDOMEN:abdomen soft, non-tender and normal bowel sounds Musculoskeletal:no cyanosis of digits and no clubbing  NEURO: alert & oriented x 3 with fluent speech, no focal motor/sensory deficits BREAST: The right breast has small, firm, round palpable mass in upper inner quadrant.  It is not fixed.  It is adjacent to prior surgical scar which is well-healed.  Slightly tender to palpate.  Generally fibrous tissue in the breast without other palpable masses or lumps.  There is no nipple inversion or discharge.  There is no axillary lymphadenopathy on the right.  Left breast has generally fibrous tissue.  No palpable mass or lump.  There is no nipple inversion or discharge.  There is no axillary lymphadenopathy on the left.  LABORATORY DATA:  I have reviewed the data as  listed    Component Value Date/Time   NA 140 07/02/2023 0804   K 4.9 07/02/2023 0804   CL 105 07/02/2023 0804   CO2 28 07/02/2023 0804   GLUCOSE 145 (H) 07/02/2023 0804   BUN 12 07/02/2023 0804   CREATININE 0.76 07/02/2023 0804   CALCIUM 9.8 07/02/2023 0804   PROT 7.0 07/02/2023 0804   ALBUMIN 4.2 07/02/2023 0804   AST 24 07/02/2023 0804   ALT 24 07/02/2023 0804   ALKPHOS 51 07/02/2023 0804   BILITOT 0.4 07/02/2023 0804   GFRNONAA >60 07/02/2023 0804   GFRAA >60 01/04/2019 1455    Lab Results  Component Value Date   WBC 8.3 07/02/2023   NEUTROABS 5.2 07/02/2023   HGB 13.7 07/02/2023   HCT 42.3 07/02/2023   MCV 98.1 07/02/2023   PLT 266 07/02/2023

## 2023-07-02 ENCOUNTER — Inpatient Hospital Stay: Payer: Medicare Other | Admitting: Nurse Practitioner

## 2023-07-02 ENCOUNTER — Encounter: Payer: Self-pay | Admitting: Nurse Practitioner

## 2023-07-02 ENCOUNTER — Inpatient Hospital Stay: Payer: Medicare Other | Attending: Nurse Practitioner

## 2023-07-02 VITALS — BP 138/83 | HR 89 | Temp 97.9°F | Resp 16 | Wt 173.1 lb

## 2023-07-02 DIAGNOSIS — M8580 Other specified disorders of bone density and structure, unspecified site: Secondary | ICD-10-CM | POA: Insufficient documentation

## 2023-07-02 DIAGNOSIS — Z79810 Long term (current) use of selective estrogen receptor modulators (SERMs): Secondary | ICD-10-CM | POA: Insufficient documentation

## 2023-07-02 DIAGNOSIS — D0511 Intraductal carcinoma in situ of right breast: Secondary | ICD-10-CM

## 2023-07-02 DIAGNOSIS — Z170 Estrogen receptor positive status [ER+]: Secondary | ICD-10-CM | POA: Diagnosis not present

## 2023-07-02 LAB — CBC WITH DIFFERENTIAL (CANCER CENTER ONLY)
Abs Immature Granulocytes: 0.03 10*3/uL (ref 0.00–0.07)
Basophils Absolute: 0.1 10*3/uL (ref 0.0–0.1)
Basophils Relative: 1 %
Eosinophils Absolute: 0.1 10*3/uL (ref 0.0–0.5)
Eosinophils Relative: 2 %
HCT: 42.3 % (ref 36.0–46.0)
Hemoglobin: 13.7 g/dL (ref 12.0–15.0)
Immature Granulocytes: 0 %
Lymphocytes Relative: 28 %
Lymphs Abs: 2.3 10*3/uL (ref 0.7–4.0)
MCH: 31.8 pg (ref 26.0–34.0)
MCHC: 32.4 g/dL (ref 30.0–36.0)
MCV: 98.1 fL (ref 80.0–100.0)
Monocytes Absolute: 0.6 10*3/uL (ref 0.1–1.0)
Monocytes Relative: 7 %
Neutro Abs: 5.2 10*3/uL (ref 1.7–7.7)
Neutrophils Relative %: 62 %
Platelet Count: 266 10*3/uL (ref 150–400)
RBC: 4.31 MIL/uL (ref 3.87–5.11)
RDW: 13.1 % (ref 11.5–15.5)
WBC Count: 8.3 10*3/uL (ref 4.0–10.5)
nRBC: 0 % (ref 0.0–0.2)

## 2023-07-02 LAB — CMP (CANCER CENTER ONLY)
ALT: 24 U/L (ref 0–44)
AST: 24 U/L (ref 15–41)
Albumin: 4.2 g/dL (ref 3.5–5.0)
Alkaline Phosphatase: 51 U/L (ref 38–126)
Anion gap: 7 (ref 5–15)
BUN: 12 mg/dL (ref 8–23)
CO2: 28 mmol/L (ref 22–32)
Calcium: 9.8 mg/dL (ref 8.9–10.3)
Chloride: 105 mmol/L (ref 98–111)
Creatinine: 0.76 mg/dL (ref 0.44–1.00)
GFR, Estimated: >60 mL/min (ref >60)
Glucose, Bld: 145 mg/dL — ABNORMAL HIGH (ref 70–99)
Potassium: 4.9 mmol/L (ref 3.5–5.1)
Sodium: 140 mmol/L (ref 135–145)
Total Bilirubin: 0.4 mg/dL (ref 0.0–1.2)
Total Protein: 7 g/dL (ref 6.5–8.1)

## 2023-10-24 ENCOUNTER — Ambulatory Visit
Admission: RE | Admit: 2023-10-24 | Discharge: 2023-10-24 | Disposition: A | Discharge status: Home / Self Care | Source: Ambulatory Visit | Attending: Nurse Practitioner | Admitting: Nurse Practitioner

## 2023-10-24 DIAGNOSIS — D0511 Intraductal carcinoma in situ of right breast: Secondary | ICD-10-CM

## 2023-10-24 MED ORDER — GADOPICLENOL 0.5 MMOL/ML IV SOLN
8.0000 mL | Freq: Once | INTRAVENOUS | Status: AC | PRN
  Administered 2023-10-24: 8 mL via INTRAVENOUS

## 2023-12-27 NOTE — Assessment & Plan Note (Signed)
 Diagnosed 07/26/2020, S/p right lumpectomy on 09/29/20 (Dr. Curvin) and adjuvant radiation (Dr. Dewey) 6/13-7/11/22. -she began tamoxifen  in 01/2021, tolerating well with tolerable hot flashes and joint pain, plan for 5 years -continue annual MM and MRI for breast cancer screening

## 2023-12-29 ENCOUNTER — Other Ambulatory Visit: Payer: Self-pay

## 2023-12-29 ENCOUNTER — Inpatient Hospital Stay (HOSPITAL_BASED_OUTPATIENT_CLINIC_OR_DEPARTMENT_OTHER): Payer: Medicare Other | Admitting: Hematology

## 2023-12-29 ENCOUNTER — Inpatient Hospital Stay: Payer: Medicare Other | Attending: Hematology

## 2023-12-29 VITALS — BP 136/76 | HR 78 | Temp 98.5°F | Resp 16 | Wt 168.0 lb

## 2023-12-29 DIAGNOSIS — Z8551 Personal history of malignant neoplasm of bladder: Secondary | ICD-10-CM | POA: Insufficient documentation

## 2023-12-29 DIAGNOSIS — Z170 Estrogen receptor positive status [ER+]: Secondary | ICD-10-CM | POA: Insufficient documentation

## 2023-12-29 DIAGNOSIS — Z801 Family history of malignant neoplasm of trachea, bronchus and lung: Secondary | ICD-10-CM | POA: Diagnosis not present

## 2023-12-29 DIAGNOSIS — Z808 Family history of malignant neoplasm of other organs or systems: Secondary | ICD-10-CM | POA: Insufficient documentation

## 2023-12-29 DIAGNOSIS — Z1239 Encounter for other screening for malignant neoplasm of breast: Secondary | ICD-10-CM

## 2023-12-29 DIAGNOSIS — Z923 Personal history of irradiation: Secondary | ICD-10-CM | POA: Insufficient documentation

## 2023-12-29 DIAGNOSIS — D0511 Intraductal carcinoma in situ of right breast: Secondary | ICD-10-CM | POA: Insufficient documentation

## 2023-12-29 DIAGNOSIS — Z1231 Encounter for screening mammogram for malignant neoplasm of breast: Secondary | ICD-10-CM | POA: Diagnosis not present

## 2023-12-29 DIAGNOSIS — L905 Scar conditions and fibrosis of skin: Secondary | ICD-10-CM | POA: Diagnosis not present

## 2023-12-29 DIAGNOSIS — Z79810 Long term (current) use of selective estrogen receptor modulators (SERMs): Secondary | ICD-10-CM | POA: Insufficient documentation

## 2023-12-29 DIAGNOSIS — Z803 Family history of malignant neoplasm of breast: Secondary | ICD-10-CM | POA: Diagnosis not present

## 2023-12-29 LAB — CMP (CANCER CENTER ONLY)
ALT: 12 U/L (ref 0–44)
AST: 16 U/L (ref 15–41)
Albumin: 4 g/dL (ref 3.5–5.0)
Alkaline Phosphatase: 48 U/L (ref 38–126)
Anion gap: 6 (ref 5–15)
BUN: 12 mg/dL (ref 8–23)
CO2: 29 mmol/L (ref 22–32)
Calcium: 9.1 mg/dL (ref 8.9–10.3)
Chloride: 104 mmol/L (ref 98–111)
Creatinine: 0.75 mg/dL (ref 0.44–1.00)
GFR, Estimated: >60 mL/min (ref >60)
Glucose, Bld: 105 mg/dL — ABNORMAL HIGH (ref 70–99)
Potassium: 4.4 mmol/L (ref 3.5–5.1)
Sodium: 139 mmol/L (ref 135–145)
Total Bilirubin: 0.5 mg/dL (ref 0.0–1.2)
Total Protein: 6.8 g/dL (ref 6.5–8.1)

## 2023-12-29 LAB — CBC WITH DIFFERENTIAL (CANCER CENTER ONLY)
Abs Immature Granulocytes: 0.04 K/uL (ref 0.00–0.07)
Basophils Absolute: 0.1 K/uL (ref 0.0–0.1)
Basophils Relative: 1 %
Eosinophils Absolute: 0.2 K/uL (ref 0.0–0.5)
Eosinophils Relative: 3 %
HCT: 39.6 % (ref 36.0–46.0)
Hemoglobin: 13.3 g/dL (ref 12.0–15.0)
Immature Granulocytes: 1 %
Lymphocytes Relative: 29 %
Lymphs Abs: 2.3 K/uL (ref 0.7–4.0)
MCH: 31.5 pg (ref 26.0–34.0)
MCHC: 33.6 g/dL (ref 30.0–36.0)
MCV: 93.8 fL (ref 80.0–100.0)
Monocytes Absolute: 0.5 K/uL (ref 0.1–1.0)
Monocytes Relative: 7 %
Neutro Abs: 4.8 K/uL (ref 1.7–7.7)
Neutrophils Relative %: 59 %
Platelet Count: 223 K/uL (ref 150–400)
RBC: 4.22 MIL/uL (ref 3.87–5.11)
RDW: 13.2 % (ref 11.5–15.5)
WBC Count: 7.9 K/uL (ref 4.0–10.5)
nRBC: 0 % (ref 0.0–0.2)

## 2023-12-29 NOTE — Progress Notes (Signed)
 [facility]   Telephone:([phone]) [phone] Fax:([phone]) [phone]   Clinic Follow up Note   Patient Care Team: Leron Millman, NP as PCP - General (Nurse Practitioner) Tyree Nanetta SAILOR, RN as Oncology Nurse Navigator Glean, Stephane BROCKS, RN (Inactive) as Oncology Nurse Navigator Curvin Deward MOULD, MD as Consulting Physician (General Surgery) Lanny Callander, MD as Consulting Physician (Hematology) Dewey Rush, MD as Consulting Physician (Radiation Oncology) Ann Mayme POUR, NP as Nurse Practitioner (Nurse Practitioner) Tat, Asberry RAMAN, DO as Consulting Physician (Neurology)  Date of Service:  12/29/2023  CHIEF COMPLAINT: f/u of right breast DCIS  CURRENT THERAPY:  Tamoxifen  20 mg daily  Oncology History   Ductal carcinoma in situ (DCIS) of right breast Diagnosed 07/26/2020, S/p right lumpectomy on 09/29/20 (Dr. Curvin) and adjuvant radiation (Dr. Dewey) 6/13-7/11/22. -she began tamoxifen  in 01/2021, tolerating well with tolerable hot flashes and joint pain, plan for 5 years -continue annual MM and MRI for breast cancer screening  Assessment & Plan Breast cancer, post-treatment Breast cancer diagnosed in 2022, currently post-treatment. She has been on tamoxifen  for three years. Decision made to stop tamoxifen  after finishing current supply, as three years is deemed sufficient. She experiences night sweats and hot flashes, which are expected to improve after stopping tamoxifen . - Discontinue tamoxifen  after current supply is finished - Order screening mammogram at [facility] in December - Schedule annual follow-up visit in one year  Scar tissue in right breast Presence of scar tissue in the right breast at the eleven o'clock position, described as a pea-sized knot, likely scar tissue from previous surgery. Occasionally tender but well-managed since surgery.  Plan - She is clinically doing very well, no concern for recurrence - She will finish the current supply of tamoxifen , then stop.  She has  completed 3 years therapy - Continue annual screening mammogram, due in December, and breast MRI, I ordered both  - Follow-up with NP in 1 year   SUMMARY OF ONCOLOGIC HISTORY: Oncology History Overview Note  Cancer Staging Ductal carcinoma in situ (DCIS) of right breast Staging form: Breast, AJCC 8th Edition - Clinical stage from 07/26/2020: Stage 0 (cTis (DCIS), cN0, cM0, G1, ER+, PR+, HER2: Not Assessed) - Signed by Lanny Callander, MD on 08/01/2020 Stage prefix: Initial diagnosis    Ductal carcinoma in situ (DCIS) of right breast  07/18/2020 Breast MRI   IMPRESSION: Indeterminate clumped non-mass enhancement involving the UPPER central RIGHT breast spanning 1.4 x 1.2 x 1.3  cm maximally.   07/26/2020 Cancer Staging   Staging form: Breast, AJCC 8th Edition - Clinical stage from 07/26/2020: Stage 0 (cTis (DCIS), cN0, cM0, G1, ER+, PR+, HER2: Not Assessed) - Signed by Lanny Callander, MD on 08/01/2020 Stage prefix: Initial diagnosis   07/26/2020 Initial Biopsy   Diagnosis Breast, right, needle core biopsy, upper inner - DUCTAL CARCINOMA IN SITU INVOLVING COMPLEX SCLEROSING LESION WITH CALCIFICATIONS. Microscopic Comment The DCIS is of intermediate nuclear grade with focal necrosis. E-cadherin is positive and CK5/6 is negative in DCIS. P63, Calponin and SMM-1 demonstrate the presence of myoepithelium throughout. Ancillary studies will be reported separately. Results reported to [facility] on 07/27/2020. Dr. Alvaro reviewed the case.   07/26/2020 Receptors her2    Results: IMMUNOHISTOCHEMICAL AND MORPHOMETRIC ANALYSIS PERFORMED MANUALLY Estrogen Receptor: 95%, POSITIVE, STRONG STAINING INTENSITY Progesterone Receptor: 15%, POSITIVE, STRONG STAINING INTENSITY   07/31/2020 Initial Diagnosis   Ductal carcinoma in situ (DCIS) of right breast   08/21/2020 Genetic Testing   Negative genetic testing:  No  pathogenic variants detected on the [employer] CancerNext-Expanded + RNAinsight  panel. The report date is 08/21/2020.   The CancerNext-Expanded + RNAinsight gene panel offered by [employer] and includes sequencing and rearrangement analysis for the following 77 genes: AIP, ALK, APC, ATM, AXIN2, BAP1, BARD1, BLM, BMPR1A, BRCA1, BRCA2, BRIP1, CDC73, CDH1, CDK4, CDKN1B, CDKN2A, CHEK2, CTNNA1, DICER1, FANCC, FH, FLCN, GALNT12, KIF1B, LZTR1, MAX, MEN1, MET, MLH1, MSH2, MSH3, MSH6, MUTYH, NBN, NF1, NF2, NTHL1, PALB2, PHOX2B, PMS2, POT1, PRKAR1A, PTCH1, PTEN, RAD51C, RAD51D, RB1, RECQL, RET, SDHA, SDHAF2, SDHB, SDHC, SDHD, SMAD4, SMARCA4, SMARCB1, SMARCE1, STK11, SUFU, TMEM127, TP53, TSC1, TSC2, VHL and XRCC2 (sequencing and deletion/duplication); EGFR, EGLN1, HOXB13, KIT, MITF, PDGFRA, POLD1 and POLE (sequencing only); EPCAM and GREM1 (deletion/duplication only). RNA data is routinely analyzed for use in variant interpretation for all genes.   09/29/2020 Surgery   RIGHT BREAST LUMPECTOMY WITH RADIOACTIVE SEED LOCALIZATION by Dr Curvin   FINAL MICROSCOPIC DIAGNOSIS:   A. BREAST, RIGHT, LUMPECTOMY:  -  Ductal carcinoma in situ, low grade, arising in a complex sclerosing  lesion  -  Margins uninvolved by carcinoma (0.4; posterior margin)  -  Previous biopsy site changes  -  See oncology table and comment below    09/29/2020 Cancer Staging   Staging form: Breast, AJCC 8th Edition - Pathologic stage from 09/29/2020: Stage Unknown (pTis (DCIS), pNX, cM0, ER+, PR+) - Signed by Burton, Lacie K, NP on 03/04/2021 Nuclear grade: G1   11/13/2020 - 12/11/2020 Radiation Therapy   Adjuvant Radiation by Dr Dewey    03/05/2021 Survivorship   SCP delivered by Lacie Burton, NP      Discussed the use of AI scribe software for clinical note transcription with the patient, who gave verbal consent to proceed.  History of Present Illness Brittany Moreno is a 68 year old female with breast cancer who presents for follow-up.  She was diagnosed with ductal carcinoma in situ (DCIS) in 2022 and  underwent surgery. She has been on tamoxifen  since August 2022. She has a small, tender knot at the eleven o'clock position on her right breast, described as scar tissue about the size of a small pea, present since the surgery. Her last mammogram was in December, and she is due for another screening mammogram in December. A breast MRI was performed in May, and she is currently disputing an additional charge with [facility].  She experiences night sweats and hot flashes, which she attributes to tamoxifen  use.     All other systems were reviewed with the patient and are negative.  MEDICAL HISTORY:  Past Medical History:  Diagnosis Date   Family history of basal cell carcinoma    Family history of breast cancer    Family history of kidney cancer    Family history of lung cancer    Family history of thyroid cancer    High cholesterol    History of basal cell carcinoma    Hypertension     SURGICAL HISTORY: Past Surgical History:  Procedure Laterality Date   ABDOMINAL HYSTERECTOMY     BREAST LUMPECTOMY WITH RADIOACTIVE SEED LOCALIZATION Right 09/29/2020   Procedure: RIGHT BREAST LUMPECTOMY WITH RADIOACTIVE SEED LOCALIZATION;  Surgeon: Curvin Deward MOULD, MD;  Location: [facility];  Service: General;  Laterality: Right;   CARPAL TUNNEL RELEASE     CHOLECYSTECTOMY     MANDIBLE SURGERY     ROTATOR CUFF REPAIR     TUBAL LIGATION      I have reviewed the social  history and family history with the patient and they are unchanged from previous note.  ALLERGIES:  is allergic to losartan, penicillins, sulfamethoxazole-trimethoprim, erythromycin base, prednisone , erythromycin, and percocet [oxycodone-acetaminophen ].  MEDICATIONS:  Current Outpatient Medications  Medication Sig Dispense Refill   aspirin EC 81 MG tablet Take 325 mg by mouth daily.     cholecalciferol (VITAMIN D3) 25 MCG (1000 UNIT) tablet Take 5,000 Units by mouth daily.     Fluticasone Furoate-Vilanterol  (BREO ELLIPTA) 50-25 MCG/ACT AEPB Inhale 1 puff into the lungs once.     lisinopril-hydrochlorothiazide (ZESTORETIC) 20-12.5 MG tablet Take 1 tablet by mouth daily.     Multiple Vitamin (MULTIVITAMIN) tablet Take 1 tablet by mouth daily.     tamoxifen  (NOLVADEX ) 20 MG tablet Take 1 tablet (20 mg total) by mouth daily. 100 tablet 2   No current facility-administered medications for this visit.    PHYSICAL EXAMINATION: ECOG PERFORMANCE STATUS: 0 - Asymptomatic  Vitals:   12/29/23 0841  BP: 136/76  Pulse: 78  Resp: 16  Temp: 98.5 F (36.9 C)  SpO2: 97%   Wt Readings from Last 3 Encounters:  12/29/23 168 lb (76.2 kg)  07/02/23 173 lb 1.6 oz (78.5 kg)  12/30/22 167 lb 14.4 oz (76.2 kg)     GENERAL:alert, no distress and comfortable SKIN: skin color, texture, turgor are normal, no rashes or significant lesions EYES: normal, Conjunctiva are pink and non-injected, sclera clear NECK: supple, thyroid normal size, non-tender, without nodularity LYMPH:  no palpable lymphadenopathy in the cervical, axillary  LUNGS: clear to auscultation and percussion with normal breathing effort HEART: regular rate & rhythm and no murmurs and no lower extremity edema ABDOMEN:abdomen soft, non-tender and normal bowel sounds Musculoskeletal:no cyanosis of digits and no clubbing  NEURO: alert & oriented x 3 with fluent speech, no focal motor/sensory deficits BREAST: Right breast with a pea-sized, tender knot at the 11 o'clock position.  No other palpable breast mass or adenopathy Physical Exam   LABORATORY DATA:  I have reviewed the data as listed    Latest Ref Rng & Units 12/29/2023    8:17 AM 07/02/2023    8:04 AM 12/30/2022    8:50 AM  CBC  WBC 4.0 - 10.5 K/uL 7.9  8.3  8.1   Hemoglobin 12.0 - 15.0 g/dL 86.6  86.2  87.0   Hematocrit 36.0 - 46.0 % 39.6  42.3  38.4   Platelets 150 - 400 K/uL 223  266  210         Latest Ref Rng & Units 12/29/2023    8:17 AM 07/02/2023    8:04 AM 12/30/2022     8:50 AM  CMP  Glucose 70 - 99 mg/dL 894  854  884   BUN 8 - 23 mg/dL 12  12  16    Creatinine 0.44 - 1.00 mg/dL 9.24  9.23  9.20   Sodium 135 - 145 mmol/L 139  140  139   Potassium 3.5 - 5.1 mmol/L 4.4  4.9  4.1   Chloride 98 - 111 mmol/L 104  105  105   CO2 22 - 32 mmol/L 29  28  27    Calcium 8.9 - 10.3 mg/dL 9.1  9.8  8.9   Total Protein 6.5 - 8.1 g/dL 6.8  7.0  6.0   Total Bilirubin 0.0 - 1.2 mg/dL 0.5  0.4  0.3   Alkaline Phos 38 - 126 U/L 48  51  33   AST 15 - 41 U/L 16  24  21   ALT 0 - 44 U/L 12  24  25        RADIOGRAPHIC STUDIES: I have personally reviewed the radiological images as listed and agreed with the findings in the report. No results found.    Orders Placed This Encounter  Procedures   MM Digital Screening    Standing Status:   Future    Expected Date:   05/22/2024    Expiration Date:   12/28/2024    Scheduling Instructions:     [facility]    Reason for Exam (SYMPTOM  OR DIAGNOSIS REQUIRED):   screening    Preferred imaging location?:   External   MR BREAST BILATERAL W WO CONTRAST INC CAD    Standing Status:   Future    Expected Date:   10/20/2024    Expiration Date:   12/28/2024    If indicated for the ordered procedure, I authorize the administration of contrast media per Radiology protocol:   Yes    What is the patient's sedation requirement?:   No Sedation    Does the patient have a pacemaker or implanted devices?:   No    Radiology Contrast Protocol - do NOT remove file path:   [email or web address]    Preferred imaging location?:   [facility] (table limit-550lbs)   All questions were answered. The patient knows to call the clinic with any problems, questions or concerns. No barriers to learning was detected. The total time spent in the appointment was 25 minutes, including review of chart and various tests results, discussions about plan of care and coordination of care plan     Brittany Mattock, MD 12/29/2023

## 2024-05-21 LAB — SURGICAL PATHOLOGY

## 2024-06-08 ENCOUNTER — Encounter: Payer: Self-pay | Admitting: Hematology

## 2024-12-28 ENCOUNTER — Ambulatory Visit: Admitting: Hematology
